# Patient Record
Sex: Female | Born: 1980 | Race: Black or African American | Hispanic: No | Marital: Single | Smoking: Former smoker
Health system: Southern US, Community
[De-identification: ages and names within clinical notes are randomized; demographics above are authoritative.]

## PROBLEM LIST (undated history)

## (undated) DIAGNOSIS — Z5189 Encounter for other specified aftercare: Secondary | ICD-10-CM

## (undated) DIAGNOSIS — E119 Type 2 diabetes mellitus without complications: Secondary | ICD-10-CM

## (undated) DIAGNOSIS — F419 Anxiety disorder, unspecified: Secondary | ICD-10-CM

## (undated) DIAGNOSIS — K219 Gastro-esophageal reflux disease without esophagitis: Secondary | ICD-10-CM

## (undated) DIAGNOSIS — I10 Essential (primary) hypertension: Secondary | ICD-10-CM

## (undated) HISTORY — PX: TUBAL LIGATION: SHX77

## (undated) HISTORY — PX: ABDOMINAL HYSTERECTOMY: SHX81

## (undated) HISTORY — PX: ANKLE SURGERY: SHX546

---

## 2009-12-09 ENCOUNTER — Ambulatory Visit: Payer: Self-pay | Admitting: Diagnostic Radiology

## 2009-12-09 ENCOUNTER — Emergency Department (HOSPITAL_BASED_OUTPATIENT_CLINIC_OR_DEPARTMENT_OTHER): Admission: EM | Admit: 2009-12-09 | Discharge: 2009-12-09 | Payer: Self-pay | Admitting: Emergency Medicine

## 2010-02-27 ENCOUNTER — Emergency Department (HOSPITAL_BASED_OUTPATIENT_CLINIC_OR_DEPARTMENT_OTHER): Admission: EM | Admit: 2010-02-27 | Discharge: 2010-02-27 | Payer: Self-pay | Admitting: Emergency Medicine

## 2010-08-11 ENCOUNTER — Emergency Department (HOSPITAL_BASED_OUTPATIENT_CLINIC_OR_DEPARTMENT_OTHER): Admission: EM | Admit: 2010-08-11 | Discharge: 2010-08-11 | Payer: Self-pay | Admitting: Emergency Medicine

## 2010-08-13 ENCOUNTER — Emergency Department (HOSPITAL_BASED_OUTPATIENT_CLINIC_OR_DEPARTMENT_OTHER): Admission: EM | Admit: 2010-08-13 | Discharge: 2010-08-13 | Payer: Self-pay | Admitting: Emergency Medicine

## 2011-01-08 LAB — RAPID STREP SCREEN (MED CTR MEBANE ONLY): Streptococcus, Group A Screen (Direct): NEGATIVE

## 2011-05-18 ENCOUNTER — Emergency Department (INDEPENDENT_AMBULATORY_CARE_PROVIDER_SITE_OTHER): Payer: Medicaid Other

## 2011-05-18 ENCOUNTER — Emergency Department (HOSPITAL_BASED_OUTPATIENT_CLINIC_OR_DEPARTMENT_OTHER)
Admission: EM | Admit: 2011-05-18 | Discharge: 2011-05-18 | Disposition: A | Discharge status: Home / Self Care | Payer: Medicaid Other | Attending: Emergency Medicine | Admitting: Emergency Medicine

## 2011-05-18 ENCOUNTER — Encounter: Payer: Self-pay | Admitting: *Deleted

## 2011-05-18 DIAGNOSIS — F17200 Nicotine dependence, unspecified, uncomplicated: Secondary | ICD-10-CM | POA: Insufficient documentation

## 2011-05-18 DIAGNOSIS — W19XXXA Unspecified fall, initial encounter: Secondary | ICD-10-CM | POA: Insufficient documentation

## 2011-05-18 DIAGNOSIS — M25579 Pain in unspecified ankle and joints of unspecified foot: Secondary | ICD-10-CM

## 2011-05-18 DIAGNOSIS — Y92009 Unspecified place in unspecified non-institutional (private) residence as the place of occurrence of the external cause: Secondary | ICD-10-CM | POA: Insufficient documentation

## 2011-05-18 DIAGNOSIS — S93409A Sprain of unspecified ligament of unspecified ankle, initial encounter: Secondary | ICD-10-CM

## 2011-05-18 DIAGNOSIS — R609 Edema, unspecified: Secondary | ICD-10-CM

## 2011-05-18 DIAGNOSIS — W010XXA Fall on same level from slipping, tripping and stumbling without subsequent striking against object, initial encounter: Secondary | ICD-10-CM

## 2011-05-18 DIAGNOSIS — S82899A Other fracture of unspecified lower leg, initial encounter for closed fracture: Secondary | ICD-10-CM

## 2011-05-18 MED ORDER — IBUPROFEN 600 MG PO TABS: 600.0000 mg | ORAL_TABLET | Freq: Four times a day (QID) | ORAL | Status: AC | PRN

## 2011-05-18 MED ORDER — ACETAMINOPHEN-CODEINE #3 300-30 MG PO TABS: 1.0000 | ORAL_TABLET | Freq: Four times a day (QID) | ORAL | Status: DC | PRN

## 2011-05-18 MED ORDER — IBUPROFEN 800 MG PO TABS
800.0000 mg | ORAL_TABLET | Freq: Once | ORAL | Status: AC
  Administered 2011-05-18: 800 mg via ORAL
  Filled 2011-05-18: qty 1

## 2011-05-18 MED ORDER — ACETAMINOPHEN-CODEINE #3 300-30 MG PO TABS
2.0000 | ORAL_TABLET | Freq: Once | ORAL | Status: AC
  Administered 2011-05-18: 2 via ORAL
  Filled 2011-05-18: qty 2

## 2011-05-18 NOTE — ED Notes (Signed)
Tripped over cords and twisted right ankle around 1:30am. Woke up at 4:30 with increased pain and swelling.

## 2011-05-18 NOTE — Discharge Instructions (Signed)
 Ankle Sprain An ankle sprain is an injury to the ligaments that hold the ankle joint together.   CAUSE The injury is usually caused by a fall or by twisting the ankle. It is important to tell your caregiver how the injury occurred and whether or not you were able to walk immediately after the injury.  SYMPTOMS Pain is the primary symptom. It may be present at rest or only when you are trying to stand or walk. The ankle will likely be swollen. Bruising may develop immediately or after 1 or 2 days. It may be difficult or impossible to stand or walk. This depends on the severity of the sprain. DIAGNOSIS Your caregiver can determine if a sprain has occurred based on the accident details and on examination of your ankle. Examination will include pressing and squeezing areas of the foot and ankle. Your caregiver will try to move the ankle in certain ways. X-rays may be used to be sure a bone was not broken, or that the ligament did not pull off of a bone (avulsion). There are standard guidelines that can reliably determine if an x-ray is needed. RISKS AND COMPLICATIONS A person who has sprained their ankle will be more prone to a repeat sprain. Long term pain with standing or walking or difficulty walking (chronic instability of the ankle) may result from an ankle sprain. TREATMENT Rest, ice, elevation, and compression are the basic modes of treatment (see home care instructions, below). Certain types of braces can help stabilize the ankle and allow early return to walking. Your caregiver can make a recommendation for this. Medication may be recommended for pain. You may be referred to an orthopedist or a physical therapist for certain types of severe sprains. HOME CARE INSTRUCTIONS  Apply ice to the sore area for 10 minutes 3 times per day. Do this while you are awake for the first 2 days, or as directed. This can be stopped when the swelling goes away. Put the ice in a plastic bag and place a towel  between the bag of ice and your skin.   Keep your leg elevated when possible to lessen swelling.   If your caregiver recommends crutches, use them as instructed with a non-weight bearing cast for 1 week. Then, you may walk on your ankle as the pain allows, or as instructed. Gradually, put weight on the affected ankle. Continue to use crutches or cane until you can walk without causing pain.   If a plaster splint was applied, wear the splint until you are seen for a follow-up examination. Rest it on nothing harder than a pillow the first 24 hours. DO NOT put weight on it. DO NOT get it wet. You may take it off to take a shower or bath.   You may have been given an elastic bandage to use with the plaster splint, or you may have been given a elastic bandage to use alone. The elastic bandage is too tight if you have numbness, tingling, or if your foot becomes cold and blue. Adjust the bandage to make it comfortable.   If an air splint was applied, you may blow more air into it or take some out to make it more comfortable. You may take it off at night and to take a shower or bath. Wiggle your toes in the splint several times per day if you are able.   Only take over-the-counter or prescription medicines for pain, discomfort, or fever as directed by your caregiver.  Do not drive a vehicle until your caregiver specifically tells you it is safe to do so.  SEEK MEDICAL CARE IF:  You have an increase in bruising, swelling, or pain.   You notice coldness of your toes.   Pain relief is not achieved with medications.  SEEK IMMEDIATE MEDICAL CARE IF: your toes are numb or blue or you have severe pain. MAKE SURE YOU:   Understand these instructions.   Will watch your condition.   Will get help right away if you are not doing well or get worse.  Document Released: 10/13/2005 Document Re-Released: 01/09/2009 [facility] Patient Information 2011 [place], [place].     Narcotic and benzodiazepine use  may cause drowsiness, slowed breathing or dependence.  Please use with caution and do not drive, operate machinery or watch young children alone while taking them.  Taking combinations of these medications or drinking alcohol will potentiate these effects.

## 2011-05-18 NOTE — ED Provider Notes (Addendum)
History     Chief Complaint  Patient presents with  . Ankle Pain   Patient is a 30 y.o. female presenting with ankle pain. The history is provided by the patient.  Ankle Pain  The incident occurred 3 to 5 hours ago. The incident occurred at home. The injury mechanism was a fall and torsion. The pain is present in the right ankle. The pain is severe. The pain has been constant since onset. Associated symptoms include inability to bear weight and loss of motion. Pertinent negatives include no numbness, no muscle weakness, no loss of sensation and no tingling. The symptoms are aggravated by bearing weight, palpation and activity. She has tried nothing for the symptoms.    History reviewed. No pertinent past medical history.  History reviewed. No pertinent past surgical history.  History reviewed. No pertinent family history.  History  Substance Use Topics  . Smoking status: Current Everyday Smoker  . Smokeless tobacco: Not on file  . Alcohol Use: No    OB History    Grav Para Term Preterm Abortions TAB SAB Ect Mult Living                  Review of Systems  Respiratory: Negative for chest tightness and shortness of breath.   Musculoskeletal: Positive for joint swelling. Negative for back pain.  Skin: Negative for wound.  Neurological: Negative for tingling, weakness and numbness.  All other systems reviewed and are negative.    Physical Exam  BP 135/79  Pulse 83  Temp(Src) 99.2 F (37.3 C) (Oral)  Resp 18  SpO2 100%  LMP 04/28/2011  Physical Exam  Constitutional: She appears well-developed and well-nourished.  Musculoskeletal: She exhibits edema.       Right knee: She exhibits normal range of motion. no tenderness found.       Right ankle: She exhibits decreased range of motion, swelling and ecchymosis. She exhibits no deformity, no laceration and normal pulse. tenderness. Lateral malleolus and medial malleolus tenderness found. No head of 5th metatarsal and no  proximal fibula tenderness found. Achilles tendon exhibits no pain, no defect and normal Thompson's test results.    ED Course  Procedures  MDM [place] rules apply, will get plain film, no foot tenderness.      6:43 AM I reviewed plain films of ankle, no obv fracture seen.  Splint applied (ASO) and crutches provided for comfort.  RICE therapy discussed  Gavin Pound. Oletta Lamas, MD 05/18/11 [phone]   Discussed with radiology.  Some widening of ankle mortise seen on one view.  Also possible lucency through distal fibula seen on one view not the other 2 views.  Pt did not have tenderness of fibula proximally or middle of lower leg so clinically I doubt fracture there.  Pt did not have any ankle anterior or posterior drawer instability on exam.  Pt placed in bulky ASO splint, crutches provided and referred to Dr. Pearletha Forge for follow up appointment which pt could use in follow up.  Pt told about overread results.  Gavin Pound. Oletta Lamas, MD 05/18/11 [phone]

## 2011-05-18 NOTE — ED Notes (Signed)
Dr Oletta Lamas called flow manager with instructions to call patient regarding abnormal ankle x-ray. Dr Oletta Lamas gave telephone order to call patient and have them return for repeat ankle x-ray and tib/fib x-ray or if patient refused to return stress no weight bearing on ankle and instruction prompt follow-up with orthopedic tomorrow as instructed in d/c instructions. Contacted patient by phone with information concerning x-ray and MD instructions to return. Patient stated she would be returning to ED.

## 2011-05-19 ENCOUNTER — Emergency Department (INDEPENDENT_AMBULATORY_CARE_PROVIDER_SITE_OTHER): Payer: Medicaid Other

## 2011-05-19 ENCOUNTER — Encounter (HOSPITAL_BASED_OUTPATIENT_CLINIC_OR_DEPARTMENT_OTHER): Payer: Self-pay | Admitting: *Deleted

## 2011-05-19 ENCOUNTER — Emergency Department (HOSPITAL_BASED_OUTPATIENT_CLINIC_OR_DEPARTMENT_OTHER)
Admission: EM | Admit: 2011-05-19 | Discharge: 2011-05-20 | Disposition: A | Discharge status: Home / Self Care | Payer: Medicaid Other | Attending: Emergency Medicine | Admitting: Emergency Medicine

## 2011-05-19 DIAGNOSIS — M79609 Pain in unspecified limb: Secondary | ICD-10-CM

## 2011-05-19 DIAGNOSIS — M25579 Pain in unspecified ankle and joints of unspecified foot: Secondary | ICD-10-CM

## 2011-05-19 DIAGNOSIS — W19XXXA Unspecified fall, initial encounter: Secondary | ICD-10-CM | POA: Insufficient documentation

## 2011-05-19 DIAGNOSIS — S82409A Unspecified fracture of shaft of unspecified fibula, initial encounter for closed fracture: Secondary | ICD-10-CM

## 2011-05-19 DIAGNOSIS — S82899A Other fracture of unspecified lower leg, initial encounter for closed fracture: Secondary | ICD-10-CM

## 2011-05-19 DIAGNOSIS — Y92009 Unspecified place in unspecified non-institutional (private) residence as the place of occurrence of the external cause: Secondary | ICD-10-CM | POA: Insufficient documentation

## 2011-05-19 MED ORDER — OXYCODONE-ACETAMINOPHEN 5-325 MG PO TABS: 2.0000 | ORAL_TABLET | ORAL | Status: AC | PRN

## 2011-05-19 NOTE — ED Provider Notes (Signed)
History     Chief Complaint  Patient presents with  . Extremity Pain  . Foot Injury   Patient is a 30 y.o. female presenting with extremity pain and foot injury. The history is provided by the patient. No language interpreter was used.  Extremity Pain This is a new problem. The current episode started yesterday. The problem occurs constantly. The problem has been unchanged. Pertinent negatives include no numbness. The symptoms are aggravated by walking and standing. She has tried oral narcotics for the symptoms. The treatment provided mild relief.  Foot Injury  The incident occurred yesterday. The incident occurred at home. The injury mechanism was a fall. The pain is present in the right ankle and right leg. The quality of the pain is described as aching and throbbing. The pain is at a severity of 7/10. The pain is moderate. The pain has been constant since onset. Associated symptoms include tingling. Pertinent negatives include no numbness. She reports no foreign bodies present. The symptoms are aggravated by bearing weight, activity and palpation. Treatments tried: narcotics and aso splint. The treatment provided mild relief.    History reviewed. No pertinent past medical history.  History reviewed. No pertinent past surgical history.  History reviewed. No pertinent family history.  History  Substance Use Topics  . Smoking status: Current Everyday Smoker  . Smokeless tobacco: Not on file  . Alcohol Use: No    OB History    Grav Para Term Preterm Abortions TAB SAB Ect Mult Living                  Review of Systems  Neurological: Positive for tingling. Negative for numbness.  All other systems reviewed and are negative.    Physical Exam  BP 146/73  Pulse 96  Temp(Src) 98.4 F (36.9 C) (Oral)  Resp 18  SpO2 100%  LMP 04/28/2011  Physical Exam  Nursing note and vitals reviewed. Constitutional: She is oriented to person, place, and time. She appears well-developed and  well-nourished.  HENT:  Head: Normocephalic and atraumatic.  Eyes: Pupils are equal, round, and reactive to light.  Neck: Normal range of motion.  Cardiovascular: Normal rate and regular rhythm.   Pulmonary/Chest: Effort normal and breath sounds normal.  Musculoskeletal:       Right lower leg: She exhibits tenderness and swelling.       Right foot: She exhibits tenderness and swelling.  Neurological: She is alert and oriented to person, place, and time.  Skin: Skin is warm and dry.    ED Course  Procedures   Dg Tibia/fibula Right  05/19/2011  *RADIOLOGY REPORT*  Clinical Data: Pain post fall  RIGHT TIBIA AND FIBULA - 2 VIEW  Comparison:   the previous day's study  Findings: There is a minimally comminuted fracture of the distal fibular diaphysis, with posterior comminuted fragment displaced posteriorly approximately 4 mm.  IMPRESSION:  Minimally comminuted distal fibular shaft fracture.  Original Report Authenticated By: Thora Lance III, M.D.   Dg Ankle Complete Right  05/19/2011  *RADIOLOGY REPORT*  Clinical Data: Pain post fall  RIGHT ANKLE - COMPLETE 3+ VIEW  Comparison:   the previous day's study  Findings: Fibular diaphyseal comminuted fractures again noted. There is widening of the ankle mortise laterally.  The posterior malleolar avulsion fracture seen previously is less conspicuous. No other fractures evident.  IMPRESSION:  1.  Fibular fracture with widening of the ankle mortise laterally suggesting disruption of the tibiofibular syndesmosis, potentially unstable injury.  Original  Report Authenticated By: Osa Craver, M.D.    MDM Discussed finding with Dr. Victorino Dike with ortho and we discussed with the pt that appropriate splint and non wt bearing and pt to have surgery and be seen on Wednesday:pt splint placed by nursing staff      Teressa Lower, NP 05/19/11 [phone]  Teressa Lower, NP 05/19/11 2345

## 2011-05-19 NOTE — ED Notes (Signed)
Patient splinted short leg posterior/stirrup with fiber glass splinting materials and ace wraps. Patient denies crutches today stating she was fitted appropriately for them yesterday.

## 2011-05-19 NOTE — ED Notes (Signed)
Pt assisted to restroom, pt tolerated well. Pt assisted back to bed, ice pack re-applied to right ankle.

## 2011-05-19 NOTE — ED Notes (Signed)
Pt states that ankle has increased swelling from yesterday, denies using any ice therapy at home and states that she hasn't been wearing ankle brace d/t pain.

## 2011-05-19 NOTE — ED Notes (Signed)
Pt here for repeat ankle and tib/fib xray per Dr Oletta Lamas and previous ER notes

## 2011-05-20 NOTE — ED Provider Notes (Signed)
Medical screening examination/treatment/procedure(s) were performed by non-physician practitioner and as supervising physician I was immediately available for consultation/collaboration.  Sherry Blanchard 05/20/11 [phone]

## 2011-05-23 ENCOUNTER — Ambulatory Visit (HOSPITAL_COMMUNITY): Payer: Medicaid Other | Attending: Orthopedic Surgery

## 2011-05-23 ENCOUNTER — Ambulatory Visit (HOSPITAL_BASED_OUTPATIENT_CLINIC_OR_DEPARTMENT_OTHER)
Admission: RE | Admit: 2011-05-23 | Discharge: 2011-05-23 | Disposition: A | Discharge status: Home / Self Care | Payer: Medicaid Other | Source: Ambulatory Visit | Attending: Orthopedic Surgery | Admitting: Orthopedic Surgery

## 2011-05-23 DIAGNOSIS — F17200 Nicotine dependence, unspecified, uncomplicated: Secondary | ICD-10-CM | POA: Insufficient documentation

## 2011-05-23 DIAGNOSIS — Z01812 Encounter for preprocedural laboratory examination: Secondary | ICD-10-CM | POA: Insufficient documentation

## 2011-05-23 DIAGNOSIS — Y92009 Unspecified place in unspecified non-institutional (private) residence as the place of occurrence of the external cause: Secondary | ICD-10-CM | POA: Insufficient documentation

## 2011-05-23 DIAGNOSIS — S8263XA Displaced fracture of lateral malleolus of unspecified fibula, initial encounter for closed fracture: Secondary | ICD-10-CM | POA: Insufficient documentation

## 2011-05-23 DIAGNOSIS — W010XXA Fall on same level from slipping, tripping and stumbling without subsequent striking against object, initial encounter: Secondary | ICD-10-CM | POA: Insufficient documentation

## 2011-05-23 DIAGNOSIS — K219 Gastro-esophageal reflux disease without esophagitis: Secondary | ICD-10-CM | POA: Insufficient documentation

## 2011-05-23 DIAGNOSIS — S93439A Sprain of tibiofibular ligament of unspecified ankle, initial encounter: Secondary | ICD-10-CM | POA: Insufficient documentation

## 2011-05-25 NOTE — Op Note (Signed)
Sherry Blanchard, Sherry Blanchard             ACCOUNT NO.:  [id number]  MEDICAL RECORD NO.:  [id number]  LOCATION:                                 FACILITY:  PHYSICIAN:  Toni Arthurs, MD        DATE OF BIRTH:  [birth year]  DATE OF PROCEDURE:  05/23/2011 DATE OF DISCHARGE:                              OPERATIVE REPORT   PREOPERATIVE DIAGNOSES: 1. Right ankle Weber C fracture. 2. Disruption of right ankle syndesmosis.  POSTOPERATIVE DIAGNOSES: 1. Right ankle Weber C fracture. 2. Disruption of right ankle syndesmosis.  PROCEDURE: 1. Open reduction and internal fixation of right ankle syndesmosis     disruption. 2. Closed treatment of right ankle lateral malleolus fracture. 3. Intraoperative stress examination of the right ankle. 4. Intraoperative interpretation of fluoroscopic imaging.  SURGEON:  Toni Arthurs, MD  ANESTHESIA:  General, regional.  IV FLUIDS:  See anesthesia record.  ESTIMATED BLOOD LOSS:  Minimal.  TOURNIQUET TIME:  35 minutes at 250 mmHg.  COMPLICATIONS:  None apparent.  DISPOSITION:  Extubated in awake and stable to recovery.  INDICATIONS FOR PROCEDURE:  The patient is a 30 year old female who twisted her ankle, falling in her hall at home approximately 5 days ago. She had x-rays which showed a Weber C ankle fracture with disruption of the syndesmosis and widening of the ankle mortise.  She presents now for operative treatment of this unstable injury.  She understands the risks and benefits of this procedure as well as the alternative treatment options and would like to proceed.  She specifically understands risks of bleeding, infection, nerve damage, blood clots, need for additional surgery, ankle arthritis, amputation, and death.  PROCEDURE IN DETAIL:  After preoperative consent was obtained and the correct operative site was identified, the patient was brought to the operating room and placed supine on the operating table.  General anesthesia was induced.   Preoperative antibiotics were administered. Surgical time-out was taken.  The right lower extremity was prepped and draped in standard sterile fashion with tourniquet around the thigh.  AP fluoroscopic images were obtained, showing the widening of the ankle mortise.  The incision location was localized with an elevator.  The extremity was exsanguinated and the tourniquet was inflated to 250 mmHg. A lateral incision was made and sharp dissection was carried down through the skin.  Blunt dissection was carried down through the subcutaneous tissue to the level of the lateral fibula.  The syndesmosis disruption was reduced with a Weber clamp.  A mortise view was obtained, showing appropriate reduction of the syndesmosis as well as the ankle mortise.  A four-hole plate was selected and applied to the lateral aspect of the fibula.  A second most distal hole was drilled across the syndesmosis and into the medial tibia.  A fully-threaded 4 mm x 50 mm screw was inserted and noted to have appropriate purchase.  The next most proximal hole was selected and drilled.  An Arthrex TightRope was passed through the hole and toggled on the far cortex of the tibia.  It was tightened down.  The clamp was removed.  A mortise view of the ankle showed anatomic reduction of the syndesmosis and the ankle mortise.  A stress view was then obtained in dorsiflexion and external rotation and similarly showed anatomic reduction of the mortise.  All suture ends were trimmed and the wound was irrigated copiously.  The lateral incision was closed with inverted simple sutures of 3-0 Monocryl and a running 3-0 Prolene to close the skin incision.  Sterile dressings were applied followed by a well-padded short-leg splint.  Tourniquet was released at 35 minutes after application of the splint.  The patient was then awakened by anesthesia and transported to the recovery room in stable condition.  FOLLOWUP PLAN:  The patient  will be nonweightbearing on the right lower extremity.  She will follow up with me in 2 weeks for suture removal and conversion to a cast.     Toni Arthurs, MD     JH/MEDQ  D:  05/23/2011  T:  05/23/2011  Job:  [id number]  Electronically Signed by Jonny Ruiz Jonnatan Hanners  on 05/25/2011 05:59:59 PM

## 2011-07-16 ENCOUNTER — Ambulatory Visit: Payer: Medicaid Other | Admitting: Physical Therapy

## 2011-07-23 ENCOUNTER — Ambulatory Visit: Payer: Medicaid Other | Attending: Orthopedic Surgery | Admitting: Physical Therapy

## 2011-07-23 DIAGNOSIS — M25673 Stiffness of unspecified ankle, not elsewhere classified: Secondary | ICD-10-CM | POA: Insufficient documentation

## 2011-07-23 DIAGNOSIS — R262 Difficulty in walking, not elsewhere classified: Secondary | ICD-10-CM | POA: Insufficient documentation

## 2011-07-23 DIAGNOSIS — IMO0001 Reserved for inherently not codable concepts without codable children: Secondary | ICD-10-CM | POA: Insufficient documentation

## 2011-07-23 DIAGNOSIS — R5381 Other malaise: Secondary | ICD-10-CM | POA: Insufficient documentation

## 2011-07-23 DIAGNOSIS — M25579 Pain in unspecified ankle and joints of unspecified foot: Secondary | ICD-10-CM | POA: Insufficient documentation

## 2011-07-23 DIAGNOSIS — M25676 Stiffness of unspecified foot, not elsewhere classified: Secondary | ICD-10-CM | POA: Insufficient documentation

## 2011-08-13 ENCOUNTER — Ambulatory Visit: Payer: Medicaid Other | Attending: Orthopedic Surgery | Admitting: Physical Therapy

## 2011-08-13 DIAGNOSIS — IMO0001 Reserved for inherently not codable concepts without codable children: Secondary | ICD-10-CM | POA: Insufficient documentation

## 2011-08-13 DIAGNOSIS — R262 Difficulty in walking, not elsewhere classified: Secondary | ICD-10-CM | POA: Insufficient documentation

## 2011-08-13 DIAGNOSIS — R5381 Other malaise: Secondary | ICD-10-CM | POA: Insufficient documentation

## 2011-08-13 DIAGNOSIS — M25676 Stiffness of unspecified foot, not elsewhere classified: Secondary | ICD-10-CM | POA: Insufficient documentation

## 2011-08-13 DIAGNOSIS — M25579 Pain in unspecified ankle and joints of unspecified foot: Secondary | ICD-10-CM | POA: Insufficient documentation

## 2011-08-13 DIAGNOSIS — M25673 Stiffness of unspecified ankle, not elsewhere classified: Secondary | ICD-10-CM | POA: Insufficient documentation

## 2011-08-17 ENCOUNTER — Emergency Department (INDEPENDENT_AMBULATORY_CARE_PROVIDER_SITE_OTHER): Payer: Medicaid Other

## 2011-08-17 ENCOUNTER — Emergency Department (HOSPITAL_BASED_OUTPATIENT_CLINIC_OR_DEPARTMENT_OTHER)
Admission: EM | Admit: 2011-08-17 | Discharge: 2011-08-17 | Disposition: A | Discharge status: Home / Self Care | Payer: Medicaid Other | Attending: Emergency Medicine | Admitting: Emergency Medicine

## 2011-08-17 ENCOUNTER — Encounter (HOSPITAL_BASED_OUTPATIENT_CLINIC_OR_DEPARTMENT_OTHER): Payer: Self-pay | Admitting: *Deleted

## 2011-08-17 DIAGNOSIS — R109 Unspecified abdominal pain: Secondary | ICD-10-CM

## 2011-08-17 DIAGNOSIS — R110 Nausea: Secondary | ICD-10-CM

## 2011-08-17 HISTORY — DX: Gastro-esophageal reflux disease without esophagitis: K21.9

## 2011-08-17 HISTORY — DX: Anxiety disorder, unspecified: F41.9

## 2011-08-17 LAB — URINALYSIS, ROUTINE W REFLEX MICROSCOPIC
Bilirubin Urine: NEGATIVE
Glucose, UA: NEGATIVE mg/dL
Ketones, ur: 15 mg/dL — AB
Protein, ur: NEGATIVE mg/dL

## 2011-08-17 LAB — URINE MICROSCOPIC-ADD ON

## 2011-08-17 NOTE — ED Notes (Signed)
D/c home- no rx given- ambulatory without difficulty at d/c

## 2011-08-17 NOTE — ED Notes (Signed)
C/o upper abdominal pain after eating x 3 days- nausea without vomiting

## 2011-08-17 NOTE — ED Provider Notes (Signed)
History     CSN: [id number] Arrival date & time: 08/17/2011  7:44 PM   First MD Initiated Contact with Patient 08/17/11 1946      Chief Complaint  Patient presents with  . Abdominal Pain    (Consider location/radiation/quality/duration/timing/severity/associated sxs/prior treatment) HPI Comments: Pt states that she just finished here period:pt states that for the last 3 nights she has woken up with some generalized lower abdominal pain that goes away on it own:pt denies dysuria or vag discharge or fever:pt states that she feels some pressure in her rectum and she has had some constipation in the last couple of days  Patient is a 30 y.o. female presenting with abdominal pain. The history is provided by the patient. No language interpreter was used.  Abdominal Pain The primary symptoms of the illness include abdominal pain. The current episode started more than 2 days ago. The onset of the illness was sudden. The problem has been resolved.  The patient states that she believes she is currently not pregnant. The patient has not had a change in bowel habit. Additional symptoms associated with the illness include constipation. Symptoms associated with the illness do not include hematuria or back pain.    Past Medical History  Diagnosis Date  . Blood transfusion   . Acid reflux   . Anxiety     Past Surgical History  Procedure Date  . Ankle surgery   . Tubal ligation     No family history on file.  History  Substance Use Topics  . Smoking status: Current Everyday Smoker    Types: Cigars  . Smokeless tobacco: Not on file  . Alcohol Use: Yes     occasional    OB History    Grav Para Term Preterm Abortions TAB SAB Ect Mult Living                  Review of Systems  Gastrointestinal: Positive for abdominal pain and constipation.  Genitourinary: Negative for hematuria.  Musculoskeletal: Negative for back pain.  All other systems reviewed and are negative.    Allergies   Review of patient's allergies indicates no known allergies.  Home Medications   Current Outpatient Rx  Name Route Sig Dispense Refill  . ACETAMINOPHEN-CODEINE #3 300-30 MG PO TABS Oral Take 1 tablet by mouth 2 (two) times daily.      Marland Kitchen DIPHENHYDRAMINE HCL 25 MG PO CAPS Oral Take 25 mg by mouth as needed. Sleep       BP 143/70  Pulse 67  Temp 98.4 F (36.9 C)  Resp 20  Ht 5\' 7"  (1.702 m)  Wt 184 lb (83.462 kg)  BMI 28.82 kg/m2  SpO2 100%  LMP 08/12/2011  Physical Exam  Vitals reviewed. Constitutional: She is oriented to person, place, and time. She appears well-developed and well-nourished.  HENT:  Head: Normocephalic and atraumatic.  Eyes: Pupils are equal, round, and reactive to light.  Neck: Normal range of motion. Neck supple.  Cardiovascular: Normal rate and regular rhythm.   Pulmonary/Chest: Effort normal and breath sounds normal.  Abdominal: Soft. Bowel sounds are normal.  Musculoskeletal: Normal range of motion.  Neurological: She is alert and oriented to person, place, and time.  Skin: Skin is warm and dry.  Psychiatric: She has a normal mood and affect.    ED Course  Procedures (including critical care time)  Labs Reviewed  URINALYSIS, ROUTINE W REFLEX MICROSCOPIC - Abnormal; Notable for the following:    Appearance CLOUDY (*)  Specific Gravity, Urine 1.036 (*)    Hgb urine dipstick SMALL (*)    Ketones, ur 15 (*)    All other components within normal limits  URINE MICROSCOPIC-ADD ON - Abnormal; Notable for the following:    Squamous Epithelial / LPF MANY (*)    Bacteria, UA MANY (*)    All other components within normal limits  PREGNANCY, URINE   Dg Abd Acute W/chest  08/17/2011  *RADIOLOGY REPORT*  Clinical Data: Upper abdominal pain after eating, for 3 days; nausea.  ACUTE ABDOMEN SERIES (ABDOMEN 2 VIEW & CHEST 1 VIEW)  Comparison: None.  Findings: The lungs are well-aerated and clear.  There is no evidence of focal opacification, pleural  effusion or pneumothorax. The cardiomediastinal silhouette is within normal limits.  The visualized bowel gas pattern is unremarkable. Stool and air are noted throughout the colon; there is no evidence of small bowel dilatation to suggest obstruction.  No free intra-abdominal air is identified on the provided upright view.  No acute osseous abnormalities are seen; the sacroiliac joints are unremarkable in appearance.  IMPRESSION:  1.  Unremarkable bowel gas pattern; no free intra-abdominal air seen. 2.  No acute cardiopulmonary process identified.  Original Report Authenticated By: Tonia Ghent, M.D.     1. Abdominal pain       MDM  Pt in no acute distress:pt has a benign abdomen on exam:urine sent for culture as no clearly a uti:don't feel any imaging is needed at this time    Medical screening examination/treatment/procedure(s) were performed by non-physician practitioner and as supervising physician I was immediately available for consultation/collaboration. Osvaldo Human, M.D.    Teressa Lower, NP 08/17/11 2308  Carleene Cooper III, MD 08/19/11 [phone]

## 2011-08-21 ENCOUNTER — Ambulatory Visit: Payer: Medicaid Other | Admitting: Physical Therapy

## 2011-08-29 ENCOUNTER — Ambulatory Visit: Payer: Medicaid Other | Attending: Orthopedic Surgery | Admitting: Physical Therapy

## 2011-08-29 DIAGNOSIS — IMO0001 Reserved for inherently not codable concepts without codable children: Secondary | ICD-10-CM | POA: Insufficient documentation

## 2011-08-29 DIAGNOSIS — M25676 Stiffness of unspecified foot, not elsewhere classified: Secondary | ICD-10-CM | POA: Insufficient documentation

## 2011-08-29 DIAGNOSIS — R262 Difficulty in walking, not elsewhere classified: Secondary | ICD-10-CM | POA: Insufficient documentation

## 2011-08-29 DIAGNOSIS — R5381 Other malaise: Secondary | ICD-10-CM | POA: Insufficient documentation

## 2011-08-29 DIAGNOSIS — M25579 Pain in unspecified ankle and joints of unspecified foot: Secondary | ICD-10-CM | POA: Insufficient documentation

## 2011-08-29 DIAGNOSIS — M25673 Stiffness of unspecified ankle, not elsewhere classified: Secondary | ICD-10-CM | POA: Insufficient documentation

## 2012-06-29 ENCOUNTER — Emergency Department (HOSPITAL_BASED_OUTPATIENT_CLINIC_OR_DEPARTMENT_OTHER)
Admission: EM | Admit: 2012-06-29 | Discharge: 2012-06-29 | Disposition: A | Discharge status: Home / Self Care | Payer: Self-pay | Attending: Emergency Medicine | Admitting: Emergency Medicine

## 2012-06-29 ENCOUNTER — Encounter (HOSPITAL_BASED_OUTPATIENT_CLINIC_OR_DEPARTMENT_OTHER): Payer: Self-pay | Admitting: *Deleted

## 2012-06-29 DIAGNOSIS — K219 Gastro-esophageal reflux disease without esophagitis: Secondary | ICD-10-CM | POA: Insufficient documentation

## 2012-06-29 DIAGNOSIS — F411 Generalized anxiety disorder: Secondary | ICD-10-CM | POA: Insufficient documentation

## 2012-06-29 DIAGNOSIS — N390 Urinary tract infection, site not specified: Secondary | ICD-10-CM | POA: Insufficient documentation

## 2012-06-29 DIAGNOSIS — F17200 Nicotine dependence, unspecified, uncomplicated: Secondary | ICD-10-CM | POA: Insufficient documentation

## 2012-06-29 LAB — URINALYSIS, ROUTINE W REFLEX MICROSCOPIC
Glucose, UA: NEGATIVE mg/dL
Ketones, ur: 15 mg/dL — AB
Protein, ur: 30 mg/dL — AB
Urobilinogen, UA: 1 mg/dL (ref 0.0–1.0)
pH: 6 (ref 5.0–8.0)

## 2012-06-29 LAB — URINE MICROSCOPIC-ADD ON

## 2012-06-29 LAB — PREGNANCY, URINE: Preg Test, Ur: NEGATIVE

## 2012-06-29 MED ORDER — CEPHALEXIN 250 MG PO CAPS
500.0000 mg | ORAL_CAPSULE | Freq: Once | ORAL | Status: AC
  Administered 2012-06-29: 500 mg via ORAL
  Filled 2012-06-29: qty 2

## 2012-06-29 MED ORDER — HYDROCODONE-ACETAMINOPHEN 5-325 MG PO TABS: ORAL_TABLET | ORAL | Status: DC

## 2012-06-29 MED ORDER — HYDROCODONE-ACETAMINOPHEN 5-325 MG PO TABS
1.0000 | ORAL_TABLET | Freq: Once | ORAL | Status: AC
  Administered 2012-06-29: 1 via ORAL
  Filled 2012-06-29: qty 1

## 2012-06-29 MED ORDER — CEPHALEXIN 500 MG PO CAPS: 500.0000 mg | ORAL_CAPSULE | Freq: Four times a day (QID) | ORAL | Status: AC

## 2012-06-29 MED ORDER — PROMETHAZINE HCL 25 MG PO TABS: 25.0000 mg | ORAL_TABLET | Freq: Four times a day (QID) | ORAL | Status: DC | PRN

## 2012-06-29 MED ORDER — ONDANSETRON 8 MG PO TBDP
8.0000 mg | ORAL_TABLET | Freq: Once | ORAL | Status: AC
  Administered 2012-06-29: 8 mg via ORAL
  Filled 2012-06-29: qty 1

## 2012-06-29 NOTE — ED Notes (Signed)
Pt is having urinary frequency and when she goes to restroom "only a couple drops come out." Pt states that her bladder is throbbing and her pain is "worse than a 10." States she is nauseous but denies vomiting and diarrhea. Pt states her heart is "fluttering."

## 2012-06-29 NOTE — ED Notes (Signed)
Dysuria since this am. No relief with Tylenol and cranberry pills.

## 2012-06-29 NOTE — ED Provider Notes (Signed)
History     CSN: [id number]  Arrival date & time 06/29/12  1407   First MD Initiated Contact with Patient 06/29/12 1442      Chief Complaint  Patient presents with  . Dysuria    (Consider location/radiation/quality/duration/timing/severity/associated sxs/prior treatment) HPI Patient is a 31 year old female who presents today complaining of dysuria that she noted upon waking this morning. She complains of lower, pain with radiation to her lower back. She tried Tylenol as well as "cranberry pills" at home without resolution of her symptoms. She reports her pain as a 10 out of 10. Patient is afebrile and hemodynamically stable on presentation. She describes the pain as a cramping sensation. She did take Midol at around 11 AM without any relief. Patient denies any vaginal discharge and is not concerned that she might have had exposure to any section transmitted diseases. She has history of UTI and reports that this feels similarly. Patient has had nausea but no vomiting. She denies any constipation or diarrhea. There are no other associated or modifying factors. Past Medical History  Diagnosis Date  . Blood transfusion   . Acid reflux   . Anxiety     Past Surgical History  Procedure Date  . Ankle surgery   . Tubal ligation     No family history on file.  History  Substance Use Topics  . Smoking status: Current Everyday Smoker    Types: Cigars  . Smokeless tobacco: Not on file  . Alcohol Use: Yes     occasional    OB History    Grav Para Term Preterm Abortions TAB SAB Ect Mult Living                  Review of Systems  Constitutional: Positive for chills.  HENT: Negative.   Eyes: Negative.   Respiratory: Negative.   Cardiovascular: Negative.   Gastrointestinal: Positive for nausea and abdominal pain.  Genitourinary: Positive for dysuria. Negative for vaginal discharge.  Musculoskeletal: Positive for back pain.  Skin: Negative.   Neurological: Negative.     Hematological: Negative.   Psychiatric/Behavioral: Negative.   All other systems reviewed and are negative.    Allergies  Review of patient's allergies indicates no known allergies.  Home Medications   Current Outpatient Rx  Name Route Sig Dispense Refill  . ACETAMINOPHEN-CODEINE #3 300-30 MG PO TABS Oral Take 1 tablet by mouth 2 (two) times daily.      . CEPHALEXIN 500 MG PO CAPS Oral Take 1 capsule (500 mg total) by mouth 4 (four) times daily. 40 capsule 0  . DIPHENHYDRAMINE HCL 25 MG PO CAPS Oral Take 25 mg by mouth as needed. Sleep     . HYDROCODONE-ACETAMINOPHEN 5-325 MG PO TABS  Take 1-2 tabs po q 6 hours PRN pain 15 tablet 0  . PROMETHAZINE HCL 25 MG PO TABS Oral Take 1 tablet (25 mg total) by mouth every 6 (six) hours as needed for nausea. 30 tablet 0    BP 127/67  Pulse 84  Temp 98.4 F (36.9 C) (Oral)  Resp 20  SpO2 97%  LMP 05/29/2012  Physical Exam  Nursing note and vitals reviewed. GEN: Well-developed, well-nourished female in no distress, uncomfortable appearing HEENT: Atraumatic, normocephalic. Oropharynx clear without erythema EYES: PERRLA BL, no scleral icterus. NECK: Trachea midline, no meningismus CV: regular rate and rhythm. No murmurs, rubs, or gallops PULM: No respiratory distress.  No crackles, wheezes, or rales. GI: soft, mild suprapubic tenderness to palpation. No guarding, rebound. +  bowel sounds  GU: deferred Neuro: cranial nerves grossly 2-12 intact, no abnormalities of strength or sensation, A and O x 3 MSK: Patient moves all 4 extremities symmetrically, no deformity, edema, or injury noted Skin: No rashes petechiae, purpura, or jaundice Psych: no abnormality of mood   ED Course  Procedures (including critical care time)  Labs Reviewed  URINALYSIS, ROUTINE W REFLEX MICROSCOPIC - Abnormal; Notable for the following:    APPearance TURBID (*)     Specific Gravity, Urine 1.031 (*)     Hgb urine dipstick LARGE (*)     Ketones, ur 15 (*)      Protein, ur 30 (*)     Nitrite POSITIVE (*)     Leukocytes, UA LARGE (*)     All other components within normal limits  URINE MICROSCOPIC-ADD ON - Abnormal; Notable for the following:    Squamous Epithelial / LPF FEW (*)     Bacteria, UA FEW (*)     All other components within normal limits  PREGNANCY, URINE  URINE CULTURE   No results found.   1. UTI (urinary tract infection)       MDM  Patient was evaluated by myself. Based on evaluation patient had urinalysis which was positive for UTI. Urine pregnancy was negative. Patient denied any concern for sexual transmitted disease and denies any vaginal discharge. Patient was treated with oral Keflex as well as a dose of oral Zofran and Norco. Patient tolerated this well. She was hemodynamically stable here. Given that symptoms were exactly like prior UTIs as well as patient being hemodynamically stable with no vaginal discharge no additional studies are necessary. Urine culture was sent. Patient was discharged in good condition with prescription for Keflex as well as 15 tabs of Norco and prescription for promethazine which patient is found to be more affordable in the past.        Cyndra Numbers, MD 06/29/12 1623

## 2012-10-29 ENCOUNTER — Emergency Department (HOSPITAL_BASED_OUTPATIENT_CLINIC_OR_DEPARTMENT_OTHER)
Admission: EM | Admit: 2012-10-29 | Discharge: 2012-10-29 | Disposition: A | Discharge status: Home / Self Care | Payer: Self-pay | Attending: Emergency Medicine | Admitting: Emergency Medicine

## 2012-10-29 ENCOUNTER — Encounter (HOSPITAL_BASED_OUTPATIENT_CLINIC_OR_DEPARTMENT_OTHER): Payer: Self-pay | Admitting: *Deleted

## 2012-10-29 ENCOUNTER — Emergency Department (HOSPITAL_BASED_OUTPATIENT_CLINIC_OR_DEPARTMENT_OTHER): Payer: Self-pay

## 2012-10-29 DIAGNOSIS — Z8719 Personal history of other diseases of the digestive system: Secondary | ICD-10-CM | POA: Insufficient documentation

## 2012-10-29 DIAGNOSIS — Z79899 Other long term (current) drug therapy: Secondary | ICD-10-CM | POA: Insufficient documentation

## 2012-10-29 DIAGNOSIS — Z9851 Tubal ligation status: Secondary | ICD-10-CM | POA: Insufficient documentation

## 2012-10-29 DIAGNOSIS — R509 Fever, unspecified: Secondary | ICD-10-CM | POA: Insufficient documentation

## 2012-10-29 DIAGNOSIS — R5381 Other malaise: Secondary | ICD-10-CM | POA: Insufficient documentation

## 2012-10-29 DIAGNOSIS — R5383 Other fatigue: Secondary | ICD-10-CM | POA: Insufficient documentation

## 2012-10-29 DIAGNOSIS — Z8659 Personal history of other mental and behavioral disorders: Secondary | ICD-10-CM | POA: Insufficient documentation

## 2012-10-29 DIAGNOSIS — IMO0001 Reserved for inherently not codable concepts without codable children: Secondary | ICD-10-CM | POA: Insufficient documentation

## 2012-10-29 DIAGNOSIS — F17200 Nicotine dependence, unspecified, uncomplicated: Secondary | ICD-10-CM | POA: Insufficient documentation

## 2012-10-29 MED ORDER — ACETAMINOPHEN 500 MG PO TABS
1000.0000 mg | ORAL_TABLET | Freq: Once | ORAL | Status: AC
  Administered 2012-10-29: 1000 mg via ORAL
  Filled 2012-10-29: qty 2

## 2012-10-29 MED ORDER — HYDROCODONE-HOMATROPINE 5-1.5 MG/5ML PO SYRP: 5.0000 mL | ORAL_SOLUTION | ORAL | Status: DC | PRN

## 2012-10-29 MED ORDER — KETOROLAC TROMETHAMINE 60 MG/2ML IM SOLN
60.0000 mg | Freq: Once | INTRAMUSCULAR | Status: AC
  Administered 2012-10-29: 60 mg via INTRAMUSCULAR
  Filled 2012-10-29: qty 2

## 2012-10-29 MED ORDER — GUAIFENESIN 100 MG/5ML PO LIQD: 100.0000 mg | ORAL | Status: DC | PRN

## 2012-10-29 NOTE — ED Notes (Signed)
Cough with brownish sputum x 2 days. Now her chest hurts when she inhales. Aching all over.

## 2012-10-29 NOTE — ED Provider Notes (Signed)
Medical screening examination/treatment/procedure(s) were performed by non-physician practitioner and as supervising physician I was immediately available for consultation/collaboration.  Doug Sou, MD 10/29/12 [phone]

## 2012-10-29 NOTE — ED Provider Notes (Signed)
History     CSN: [id number]  Arrival date & time 10/29/12  1631   First MD Initiated Contact with Patient 10/29/12 1754      Chief Complaint  Patient presents with  . Cough    (Consider location/radiation/quality/duration/timing/severity/associated sxs/prior treatment) HPI SUBJECTIVE:  Sherry Blanchard is a 32 y.o. female who present complaining of flu-like symptoms: fevers, chills, myalgias, congestion, and  productivecough for 3 days. Denies dyspnea or wheezing. Burning pain in chest with cough. Maliase and fatigue. Denies DOE, SOB, chest tightness or pressure, radiation to left arm, jaw or back, or diaphoresis. Denies dysuria, flank pain, suprapubic pain, frequency, urgency, or hematuria. Denies headaches, light headedness, weakness, visual disturbances. Denies abdominal pain, nausea, vomiting, diarrhea or constipation.      Past Medical History  Diagnosis Date  . Blood transfusion   . Acid reflux   . Anxiety     Past Surgical History  Procedure Date  . Ankle surgery   . Tubal ligation     No family history on file.  History  Substance Use Topics  . Smoking status: Current Every Day Smoker    Types: Cigars  . Smokeless tobacco: Not on file  . Alcohol Use: Yes     Comment: occasional    OB History    Grav Para Term Preterm Abortions TAB SAB Ect Mult Living                  Review of Systems Ten systems reviewed and are negative for acute change, except as noted in the HPI.   Allergies  Review of patient's allergies indicates no known allergies.  Home Medications   Current Outpatient Rx  Name  Route  Sig  Dispense  Refill  . ACETAMINOPHEN-CODEINE #3 300-30 MG PO TABS   Oral   Take 1 tablet by mouth 2 (two) times daily.           Marland Kitchen DIPHENHYDRAMINE HCL 25 MG PO CAPS   Oral   Take 25 mg by mouth as needed. Sleep          . HYDROCODONE-ACETAMINOPHEN 5-325 MG PO TABS      Take 1-2 tabs po q 6 hours PRN pain   15 tablet   0   . PROMETHAZINE  HCL 25 MG PO TABS   Oral   Take 1 tablet (25 mg total) by mouth every 6 (six) hours as needed for nausea.   30 tablet   0     BP 113/57  Pulse 86  Temp 99.7 F (37.6 C) (Oral)  Resp 16  Ht 5' 7.5" (1.715 m)  Wt 185 lb (83.915 kg)  BMI 28.55 kg/m2  SpO2 100%  LMP 10/02/2012  Physical Exam Appears moderately ill but not toxic; temperature as noted in vitals. Ears normal. Eyes:glassy appearance, no discharge  Heart: RRR, NO M/G/R Throat and pharynx normal.   Neck supple. No adenopathyhy in the neck.  Sinuses non tender.  Diffuse wet rhonchi that clear with cough. Abdomen is soft and nontende  ED Course  Procedures (including critical care time)  Labs Reviewed - No data to display Dg Chest 2 View  10/29/2012  *RADIOLOGY REPORT*  Clinical Data: Fever and myalgias.  Smoker.  CHEST - 2 VIEW  Comparison: 12/09/2009.  Findings: Normal sized heart.  Clear lungs.  Normal appearing bones.  IMPRESSION: Normal examination, unchanged.   Original Report Authenticated By: Beckie Salts, M.D.      No diagnosis found.    MDM  8:02 PM Filed Vitals:   10/29/12 1642 10/29/12 1905  BP: 130/64 113/57  Pulse: 116 86  Temp: 100 F (37.8 C) 99.7 F (37.6 C)  TempSrc: Oral Oral  Resp: 20 16  Height: 5' 7.5" (1.715 m)   Weight: 185 lb (83.915 kg)   SpO2: 100% 100%   Patient with symptoms consistent with influenza.  Vitals are stable, low-grade fever.  No signs of dehydration, tolerating PO's.  Lungs with ronchi. Due to patient's presentation and physical exam a chest x-ray was ordered and was negative.  Discussed the cost versus benefit of Tamiflu treatment with the patient.  The patient understands that symptoms are greater than the recommended 24-48 hour window of treatment.  Patient will be discharged with instructions to orally hydrate, rest, and use over-the-counter medications such as anti-inflammatories ibuprofen and Aleve for muscle aches and Tylenol for fever.  Patient will also be  given a cough suppressant.          Arthor Captain, PA-C 10/29/12 2003

## 2012-10-29 NOTE — ED Notes (Signed)
Pt c/o increased pain in back between shoulder blades and onset of HA. Pt A&O, resting w/ head on pillow, in NAD.

## 2013-06-07 ENCOUNTER — Emergency Department (HOSPITAL_BASED_OUTPATIENT_CLINIC_OR_DEPARTMENT_OTHER): Payer: Self-pay

## 2013-06-07 ENCOUNTER — Encounter (HOSPITAL_BASED_OUTPATIENT_CLINIC_OR_DEPARTMENT_OTHER): Payer: Self-pay

## 2013-06-07 ENCOUNTER — Emergency Department (HOSPITAL_BASED_OUTPATIENT_CLINIC_OR_DEPARTMENT_OTHER)
Admission: EM | Admit: 2013-06-07 | Discharge: 2013-06-07 | Disposition: A | Discharge status: Home / Self Care | Payer: Self-pay | Attending: Emergency Medicine | Admitting: Emergency Medicine

## 2013-06-07 DIAGNOSIS — M25519 Pain in unspecified shoulder: Secondary | ICD-10-CM | POA: Insufficient documentation

## 2013-06-07 DIAGNOSIS — M25511 Pain in right shoulder: Secondary | ICD-10-CM

## 2013-06-07 DIAGNOSIS — N200 Calculus of kidney: Secondary | ICD-10-CM | POA: Insufficient documentation

## 2013-06-07 DIAGNOSIS — Z8659 Personal history of other mental and behavioral disorders: Secondary | ICD-10-CM | POA: Insufficient documentation

## 2013-06-07 DIAGNOSIS — Z3202 Encounter for pregnancy test, result negative: Secondary | ICD-10-CM | POA: Insufficient documentation

## 2013-06-07 DIAGNOSIS — F17200 Nicotine dependence, unspecified, uncomplicated: Secondary | ICD-10-CM | POA: Insufficient documentation

## 2013-06-07 DIAGNOSIS — Z8719 Personal history of other diseases of the digestive system: Secondary | ICD-10-CM | POA: Insufficient documentation

## 2013-06-07 LAB — URINE MICROSCOPIC-ADD ON

## 2013-06-07 LAB — URINALYSIS, ROUTINE W REFLEX MICROSCOPIC
Ketones, ur: 15 mg/dL — AB
Leukocytes, UA: NEGATIVE
Nitrite: NEGATIVE
Specific Gravity, Urine: 1.037 — ABNORMAL HIGH (ref 1.005–1.030)
pH: 6 (ref 5.0–8.0)

## 2013-06-07 MED ORDER — HYDROCODONE-ACETAMINOPHEN 5-325 MG PO TABS: 1.0000 | ORAL_TABLET | Freq: Three times a day (TID) | ORAL | Status: DC | PRN

## 2013-06-07 MED ORDER — CYCLOBENZAPRINE HCL 10 MG PO TABS: 10.0000 mg | ORAL_TABLET | Freq: Two times a day (BID) | ORAL | Status: DC | PRN

## 2013-06-07 MED ORDER — NAPROXEN 500 MG PO TABS: 500.0000 mg | ORAL_TABLET | Freq: Two times a day (BID) | ORAL | Status: DC

## 2013-06-07 NOTE — ED Notes (Signed)
Pt reports last night with onset of right shoulder pain with radiation to right shoulderblade area.  Denies injury or any additional symptoms.

## 2013-06-07 NOTE — ED Provider Notes (Signed)
CSN: [id number]     Arrival date & time 06/07/13  1655 History     First MD Initiated Contact with Patient 06/07/13 1710     Chief Complaint  Patient presents with  . Shoulder Pain   (Consider location/radiation/quality/duration/timing/severity/associated sxs/prior Treatment) The history is provided by the patient. No language interpreter was used.  Sherry Blanchard is a 32 year old female presenting to the emergency department with right shoulder pain that started last night. Patient described the right shoulder pain to be an aching sensation that started last night, reported that should the pain has increased today with an aching that is deeper in the right shoulder with radiation down the mid back. Patient reported that she is taking Tylenol Motrin, used warm compressions with little relief. Stated that the pain worsens when she comes in contact with cold air. Denied fall, injury, heavy lifting, fever, chills, neck pain, neck stiffness, numbness, tingling, weakness, chest pain, shortness of breath, difficulty breathing. PCP none  Past Medical History  Diagnosis Date  . Blood transfusion   . Acid reflux   . Anxiety    Past Surgical History  Procedure Laterality Date  . Ankle surgery    . Tubal ligation     No family history on file. History  Substance Use Topics  . Smoking status: Current Every Day Smoker    Types: Cigars  . Smokeless tobacco: Not on file  . Alcohol Use: Yes     Comment: occasional   OB History   Grav Para Term Preterm Abortions TAB SAB Ect Mult Living                 Review of Systems  Constitutional: Negative for fever and chills.  HENT: Negative for sore throat, trouble swallowing, neck pain and neck stiffness.   Eyes: Negative for visual disturbance.  Respiratory: Negative for chest tightness and shortness of breath.   Cardiovascular: Negative for chest pain.  Gastrointestinal: Negative for abdominal pain.  Musculoskeletal: Positive for arthralgias  (Right shoulder pain).  Neurological: Negative for dizziness, weakness, numbness and headaches.  All other systems reviewed and are negative.    Allergies  Review of patient's allergies indicates no known allergies.  Home Medications   Current Outpatient Rx  Name  Route  Sig  Dispense  Refill  . acetaminophen-codeine (TYLENOL #3) 300-30 MG per tablet   Oral   Take 1 tablet by mouth 2 (two) times daily.           . cyclobenzaprine (FLEXERIL) 10 MG tablet   Oral   Take 1 tablet (10 mg total) by mouth 2 (two) times daily as needed for muscle spasms.   20 tablet   0   . diphenhydrAMINE (BENADRYL) 25 mg capsule   Oral   Take 25 mg by mouth as needed. Sleep          . guaiFENesin (ROBITUSSIN) 100 MG/5ML liquid   Oral   Take 5-10 mLs (100-200 mg total) by mouth every 4 (four) hours as needed for cough.   60 mL   0   . HYDROcodone-acetaminophen (NORCO) 5-325 MG per tablet   Oral   Take 1 tablet by mouth every 8 (eight) hours as needed for pain.   5 tablet   0   . HYDROcodone-acetaminophen (NORCO/VICODIN) 5-325 MG per tablet      Take 1-2 tabs po q 6 hours PRN pain   15 tablet   0   . HYDROcodone-homatropine (HYCODAN) 5-1.5 MG/5ML syrup  Oral   Take 5 mLs by mouth every 4 (four) hours as needed for cough.   120 mL   0   . naproxen (NAPROSYN) 500 MG tablet   Oral   Take 1 tablet (500 mg total) by mouth 2 (two) times daily.   30 tablet   0   . EXPIRED: promethazine (PHENERGAN) 25 MG tablet   Oral   Take 1 tablet (25 mg total) by mouth every 6 (six) hours as needed for nausea.   30 tablet   0    BP 141/70  Pulse 91  Temp(Src) 98.2 F (36.8 C) (Oral)  Resp 18  Ht 5\' 8"  (1.727 m)  Wt 178 lb (80.74 kg)  BMI 27.07 kg/m2  SpO2 100%  LMP 06/04/2013 Physical Exam  Nursing note and vitals reviewed. Constitutional: She is oriented to person, place, and time. She appears well-developed and well-nourished. No distress.  HENT:  Head: Normocephalic and  atraumatic.  Eyes: Conjunctivae and EOM are normal. Pupils are equal, round, and reactive to light. Right eye exhibits no discharge.  Neck: Normal range of motion. Neck supple.  Negative neck stiffness Negative pain upon palpation cervical spine  Cardiovascular: Normal rate, regular rhythm and normal heart sounds.  Exam reveals no friction rub.   No murmur heard. Pulses:      Radial pulses are 2+ on the right side, and 2+ on the left side.  Pulmonary/Chest: Effort normal and breath sounds normal. No respiratory distress. She has no wheezes. She has no rales.  Abdominal: Soft. Bowel sounds are normal. She exhibits no distension. There is no tenderness. There is no rebound and no guarding.  Mild right-sided CVA tenderness noted Mild Murphy's sign Negative psoas and obturator  Musculoskeletal: Normal range of motion.  Full range of motion to upper extremities bilaterally Full range of motion to right shoulder-full extension, flexion, inversion, eversion, abduction, abduction Negative drop arm Negative deformities, sunken in appearance, tenting, abnormalities noted to the right shoulder  Lymphadenopathy:    She has no cervical adenopathy.  Neurological: She is alert and oriented to person, place, and time. No cranial nerve deficit. She exhibits normal muscle tone. Coordination normal.  Cranial nerves III-XII grossly intact Strength 5+/5+ with resistance to upper extremities bilaterally-equal Sensation intact  Skin: Skin is warm and dry. No rash noted. She is not diaphoretic. No erythema.  Psychiatric: She has a normal mood and affect. Her behavior is normal. Thought content normal.    ED Course   Procedures (including critical care time)  Full range of motion to the right shoulder. Upon physical exam patient reported that she been having mild discomfort to the back - patient pointed near the right flank region, positive CVA tenderness noted-ruling out of nephrolithiasis, pyelonephritis.  Patient reported that she does have a problem with abdominal discomfort, reported that the right shoulder pain as a deep dull ache-possible referred pain from gallbladder, need to rule out gallstones.  Labs Reviewed  URINALYSIS, ROUTINE W REFLEX MICROSCOPIC - Abnormal; Notable for the following:    Color, Urine AMBER (*)    APPearance CLOUDY (*)    Specific Gravity, Urine 1.037 (*)    Hgb urine dipstick LARGE (*)    Bilirubin Urine SMALL (*)    Ketones, ur 15 (*)    Protein, ur 30 (*)    All other components within normal limits  URINE MICROSCOPIC-ADD ON - Abnormal; Notable for the following:    Squamous Epithelial / LPF MANY (*)  Bacteria, UA FEW (*)    Crystals CA OXALATE CRYSTALS (*)    All other components within normal limits  PREGNANCY, URINE   Dg Shoulder Right  06/07/2013   *RADIOLOGY REPORT*  Clinical Data: Right shoulder pain, no trauma  RIGHT SHOULDER - 2+ VIEW  Comparison:  None.  Findings:  There is no evidence of fracture or dislocation.  There is no evidence of arthropathy or other focal bone abnormality. Soft tissues are unremarkable.  IMPRESSION: Negative.   Original Report Authenticated By: Christiana Pellant, M.D.   US Abdomen Complete  06/07/2013   *RADIOLOGY REPORT*  Clinical Data:  Atypical right-sided chest pain.  ABDOMINAL ULTRASOUND COMPLETE  Comparison:  None.  Findings:  Gallbladder:  Gallbladder is incompletely distended, however there is no evidence of gallstones, gallbladder wall thickening, or pericholecystic fluid.  Common Bile Duct:  Within normal limits in caliber. Measures 2 mm in diameter.  Liver: No focal mass lesion identified.  Within normal limits in parenchymal echogenicity.  IVC:  Appears normal.  Pancreas:  No abnormality identified.  Spleen:  Within normal limits in size and echotexture.  Right kidney:  Normal in size and parenchymal echogenicity.  No evidence of mass or hydronephrosis.  Left kidney:  Normal in size and parenchymal echogenicity.  No  evidence of mass or hydronephrosis.  Abdominal Aorta:  No aneurysm identified.  IMPRESSION: Negative abdominal ultrasound.   Original Report Authenticated By: Myles Rosenthal, M.D.   1. Shoulder pain, right   2. Kidney stones     MDM  Patient presenting to the emergency department with the right shoulder pain that started last night. Alert and oriented. Full range of motion to the right shoulder without difficulty. Strength intact to the right upper extremities. Right-sided CVA tenderness noted upon exam. Negative acute abdomen, negative peritoneal signs. Urine noted large amount of hemoglobin (patient currently menstruating), oxalate crystals present. Ultrasound negative for hydronephrosis of the kidneys, negative gallstones noted, normal ultrasound. Imaging of the right shoulder negative findings.  Suspicion of shoulder pain to be musculoskeletal in nature -possible strain. Possibility of kidney stones-negative hydronephrosis noted on ultrasound. Patient stable, afebrile. Discharged patient with pain medications, anti-inflammatories, muscle relaxers. Discussed with patient pain medications the course, precautions, disposal technique. Educated patient on symptoms for nephrolithiasis. Referred patient to health and wellness center. Discussed with patient to rest and stay hydrated. Discussed with patient to avoid any strenuous activity. Discussed with patient to continue to monitor symptoms and if symptoms are to worsen or change to report back to emergency department-strict return instructions given. Patient agreed to plan of care, understood, all questions answered.  Raymon Mutton, PA-C 06/08/13 2254

## 2013-06-07 NOTE — ED Notes (Signed)
Limited ROM at right shoulder, no deformity, happen since last night. No swelling noted as well.

## 2013-06-09 NOTE — ED Provider Notes (Signed)
Medical screening examination/treatment/procedure(s) were performed by non-physician practitioner and as supervising physician I was immediately available for consultation/collaboration.   Audree Camel, MD 06/09/13 1009

## 2013-09-19 ENCOUNTER — Encounter (HOSPITAL_BASED_OUTPATIENT_CLINIC_OR_DEPARTMENT_OTHER): Payer: Self-pay | Admitting: Emergency Medicine

## 2013-09-19 ENCOUNTER — Emergency Department (HOSPITAL_BASED_OUTPATIENT_CLINIC_OR_DEPARTMENT_OTHER)
Admission: EM | Admit: 2013-09-19 | Discharge: 2013-09-19 | Disposition: A | Discharge status: Home / Self Care | Payer: Medicaid Other | Attending: Emergency Medicine | Admitting: Emergency Medicine

## 2013-09-19 DIAGNOSIS — F17200 Nicotine dependence, unspecified, uncomplicated: Secondary | ICD-10-CM | POA: Insufficient documentation

## 2013-09-19 DIAGNOSIS — Z8659 Personal history of other mental and behavioral disorders: Secondary | ICD-10-CM | POA: Insufficient documentation

## 2013-09-19 DIAGNOSIS — K1370 Unspecified lesions of oral mucosa: Secondary | ICD-10-CM | POA: Insufficient documentation

## 2013-09-19 NOTE — ED Notes (Signed)
Pt reports a "bump" on her tongue x 1 week.

## 2013-09-19 NOTE — ED Notes (Signed)
MD at bedside. 

## 2013-09-19 NOTE — ED Notes (Signed)
Small raised area on lateral aspect of tongue x 1 week.  Pt reports no pain, denies injury or other symptoms.

## 2013-09-19 NOTE — ED Provider Notes (Signed)
CSN: [id number]     Arrival date & time 09/19/13  0913 History   First MD Initiated Contact with Patient 09/19/13 [phone]     Chief Complaint  Patient presents with  . Mouth Lesions   (Consider location/radiation/quality/duration/timing/severity/associated sxs/prior Treatment) Patient is a 32 y.o. female presenting with mouth sores.  Mouth Lesions Associated symptoms: no fever and no sore throat     32 year old female here with mouth lesion for one week. States she noticed the lesion first and when she was brushing her teeth. She does not remember biting her tongue or hurting that area at all. She denies fevers, chills, sweats, bleeding at the site, pain, or enlargement of the lesion at all. She states that she's been reading on the Internet and says religious without cancer as she smokes 3 cigars daily for the past 3 years. She denies history of other sores like this and has not seen any other source for this currently. It is not affecting the way she eats or drinks.   Past Medical History  Diagnosis Date  . Blood transfusion   . Acid reflux   . Anxiety    Past Surgical History  Procedure Laterality Date  . Ankle surgery    . Tubal ligation     No family history on file. History  Substance Use Topics  . Smoking status: Current Every Day Smoker    Types: Cigars  . Smokeless tobacco: Not on file  . Alcohol Use: Yes     Comment: occasional   OB History   Grav Para Term Preterm Abortions TAB SAB Ect Mult Living                 Review of Systems  Constitutional: Negative for fever, chills and appetite change.  HENT: Positive for mouth sores. Negative for sore throat.   Respiratory: Negative for cough and shortness of breath.   Cardiovascular: Negative for chest pain.  Gastrointestinal: Negative for abdominal pain.  Neurological: Negative for headaches.  All other systems reviewed and are negative.    Allergies  Review of patient's allergies indicates no known  allergies.  Home Medications  No current outpatient prescriptions on file. BP 151/70  Pulse 88  Temp(Src) 98.6 F (37 C) (Oral)  Resp 16  Ht 5\' 8"  (1.727 m)  Wt 170 lb (77.111 kg)  BMI 25.85 kg/m2  SpO2 100%  LMP 09/16/2013 Physical Exam  Nursing note and vitals reviewed. Constitutional: She is oriented to person, place, and time. She appears well-developed and well-nourished. No distress.  HENT:  Head: Normocephalic and atraumatic.  Mouth/Throat: Oral lesions present. No trismus in the jaw. Normal dentition. No dental abscesses, lacerations or dental caries.    5-7 mm firm non ulcerated solid white to flesh colored lesion at the area marked in the diagram. Non tender to palpation and no surrounding induration.   Eyes: EOM are normal. Pupils are equal, round, and reactive to light.  Neck: Neck supple.  Cardiovascular: Normal rate, regular rhythm and normal heart sounds.   No murmur heard. Pulmonary/Chest: Effort normal and breath sounds normal.  Abdominal: Soft. Bowel sounds are normal. There is no tenderness. There is no guarding.  Musculoskeletal: She exhibits no edema.  Lymphadenopathy:    She has no cervical adenopathy.  Neurological: She is alert and oriented to person, place, and time.  Skin: Skin is warm and dry. She is not diaphoretic.  Psychiatric: She has a normal mood and affect.    ED Course  Procedures (  including critical care time) Labs Review Labs Reviewed - No data to display Imaging Review No results found.  EKG Interpretation   None       MDM   1. Mouth lesion    32 y/o female with Hx of smoking here with nodule on her tongue that she is concerned may be cancer. She denies any enlargement, erythema, or pain at the site. There are no signs of infection on exam. Given that it has only been there for 7 days I don't feel that biopsy of the site is necessary. Discussed red flags with her and encouraged establishing a PCP to follow up with. Discussed  that she may need to see a dermatologist for Bx if it does not resolve.   Return for worsening symptoms.   Murtis Sink, MD [facility] Resident, PGY-2 09/19/2013, 9:53 AM       Elenora Gamma, MD 09/19/13 [phone]

## 2013-09-20 NOTE — ED Provider Notes (Signed)
Medical screening examination/treatment/procedure(s) were conducted as a shared visit with resident-physician practitioner(s) and myself.  I personally evaluated the patient during the encounter.  Pt is a 32 y.o. female with pmhx as above presenting with nontender lesion of tongue for 1 week.  Pt found to have no other intraoral lesions and is well appearing on PE.  I am unsure of cause of lesion, but doubt acute infection or cancer and feel she is safe for outpt f/u if lesion does not resolve.   Shanna Cisco, MD 09/20/13 [phone]

## 2013-09-26 ENCOUNTER — Emergency Department (HOSPITAL_BASED_OUTPATIENT_CLINIC_OR_DEPARTMENT_OTHER)
Admission: EM | Admit: 2013-09-26 | Discharge: 2013-09-26 | Disposition: A | Discharge status: Home / Self Care | Payer: Medicaid Other | Attending: Emergency Medicine | Admitting: Emergency Medicine

## 2013-09-26 ENCOUNTER — Encounter (HOSPITAL_BASED_OUTPATIENT_CLINIC_OR_DEPARTMENT_OTHER): Payer: Self-pay | Admitting: Emergency Medicine

## 2013-09-26 DIAGNOSIS — Z8659 Personal history of other mental and behavioral disorders: Secondary | ICD-10-CM | POA: Insufficient documentation

## 2013-09-26 DIAGNOSIS — Z8719 Personal history of other diseases of the digestive system: Secondary | ICD-10-CM | POA: Insufficient documentation

## 2013-09-26 DIAGNOSIS — F17200 Nicotine dependence, unspecified, uncomplicated: Secondary | ICD-10-CM | POA: Insufficient documentation

## 2013-09-26 DIAGNOSIS — K1370 Unspecified lesions of oral mucosa: Secondary | ICD-10-CM | POA: Insufficient documentation

## 2013-09-26 NOTE — ED Notes (Signed)
Small white lesion on lateral left side of pts tongue.  Denies pain.  Reports it has been there for 3 weeks.

## 2013-09-26 NOTE — ED Provider Notes (Signed)
Medical screening examination/treatment/procedure(s) were performed by non-physician practitioner and as supervising physician I was immediately available for consultation/collaboration.  EKG Interpretation   None         Gwyneth Sprout, MD 09/26/13 1025

## 2013-09-26 NOTE — ED Provider Notes (Signed)
CSN: [id number]     Arrival date & time 09/26/13  4098 History   First MD Initiated Contact with Patient 09/26/13 0957     Chief Complaint  Patient presents with  . Mouth Lesions   (Consider location/radiation/quality/duration/timing/severity/associated sxs/prior Treatment) HPI Comments: Pt is here with complaints of a mass on the left side of her tongue:pt states that she was seen here a weak ago and she was given follow up but she doesn't have insurance:denies redness or drainage to the area  The history is provided by the patient. No language interpreter was used.    Past Medical History  Diagnosis Date  . Blood transfusion   . Acid reflux   . Anxiety    Past Surgical History  Procedure Laterality Date  . Ankle surgery    . Tubal ligation     History reviewed. No pertinent family history. History  Substance Use Topics  . Smoking status: Current Every Day Smoker    Types: Cigars  . Smokeless tobacco: Not on file  . Alcohol Use: Yes     Comment: occasional   OB History   Grav Para Term Preterm Abortions TAB SAB Ect Mult Living                 Review of Systems  Constitutional: Negative.   Respiratory: Negative.   Cardiovascular: Negative.     Allergies  Review of patient's allergies indicates no known allergies.  Home Medications  No current outpatient prescriptions on file. BP 161/77  Pulse 82  Temp(Src) 97.7 F (36.5 C) (Oral)  Resp 16  SpO2 100%  LMP 09/16/2013 Physical Exam  Nursing note and vitals reviewed. Constitutional: She appears well-developed and well-nourished.  HENT:  Small firm round area to the left lateral tongue:no redness or drainage noted  Cardiovascular: Normal rate and regular rhythm.   Pulmonary/Chest: Effort normal and breath sounds normal.    ED Course  Procedures (including critical care time) Labs Review Labs Reviewed - No data to display Imaging Review No results found.  EKG Interpretation   None       MDM    1. Mouth lesion    Pt given follow up for biopsy    Teressa Lower, NP 09/26/13 1023

## 2015-01-27 IMAGING — US US ABDOMEN COMPLETE
1 series · 14 of 25 positions shown · non-contrast
Comparison: None.

CLINICAL DATA: Atypical right-sided chest pain.

ABDOMINAL ULTRASOUND COMPLETE

[Series 1: us abdomen complete · 0.32mm/px · 14 of 65 slices shown]
[im 1/65]
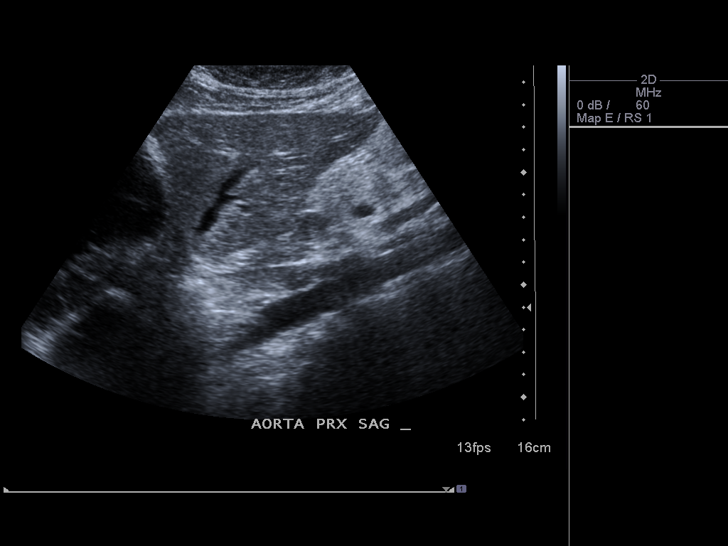
[im 6/65]
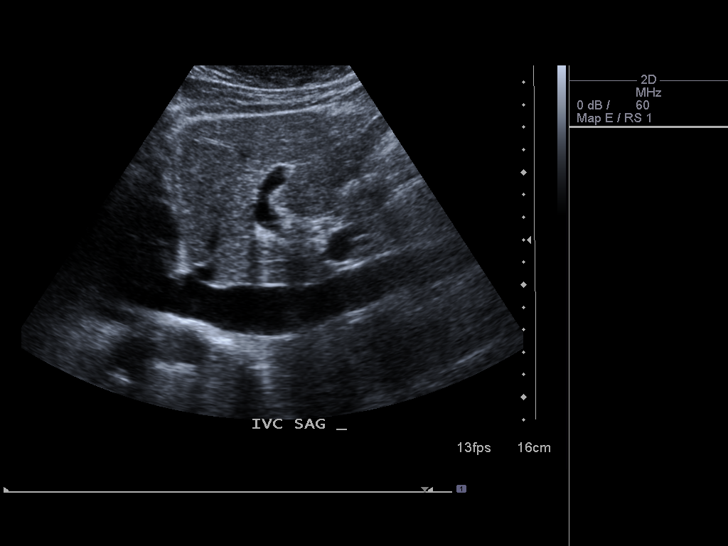
[im 11/65]
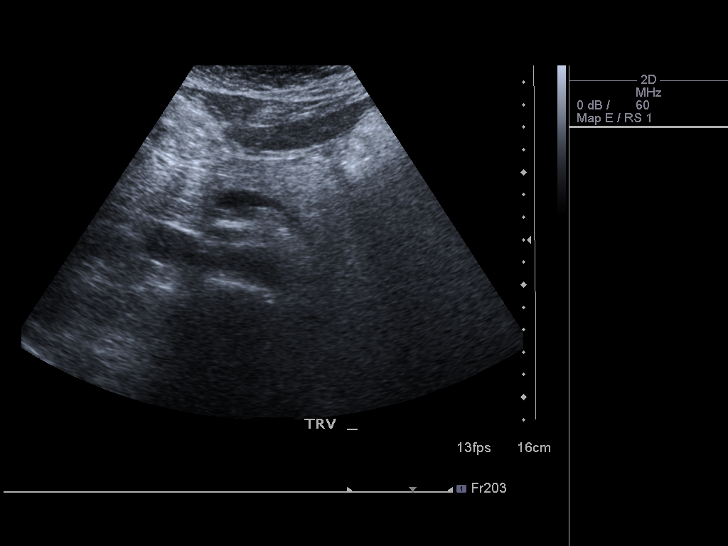
[im 17/65]
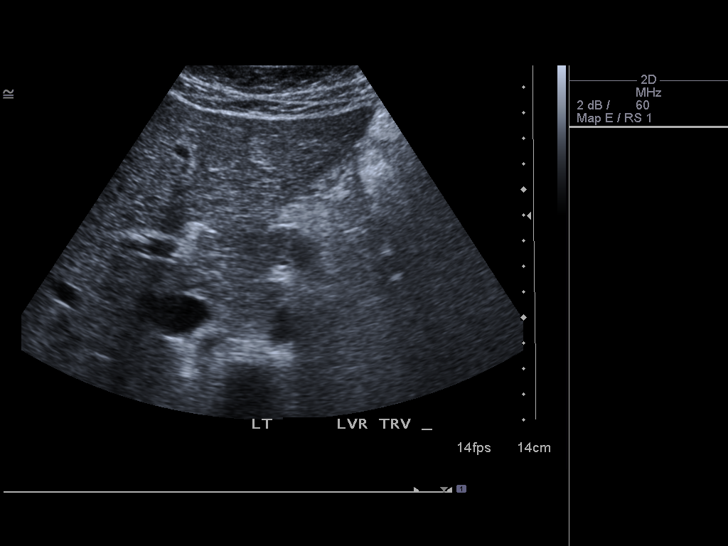
[im 22/65]
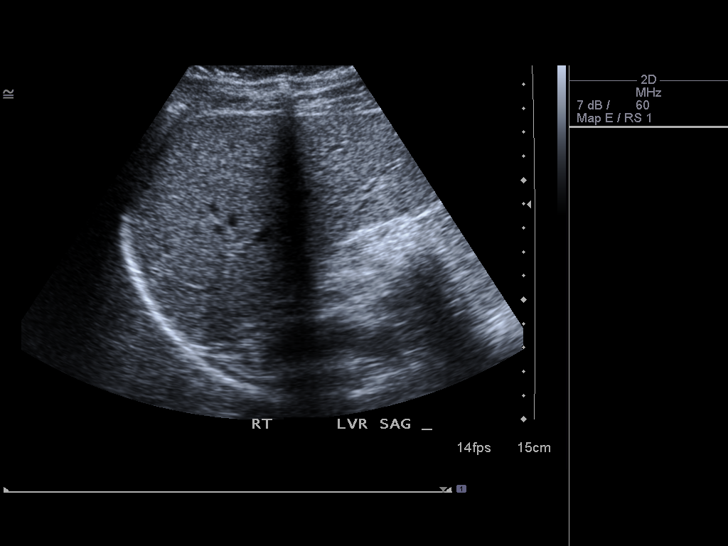
[im 25/65]
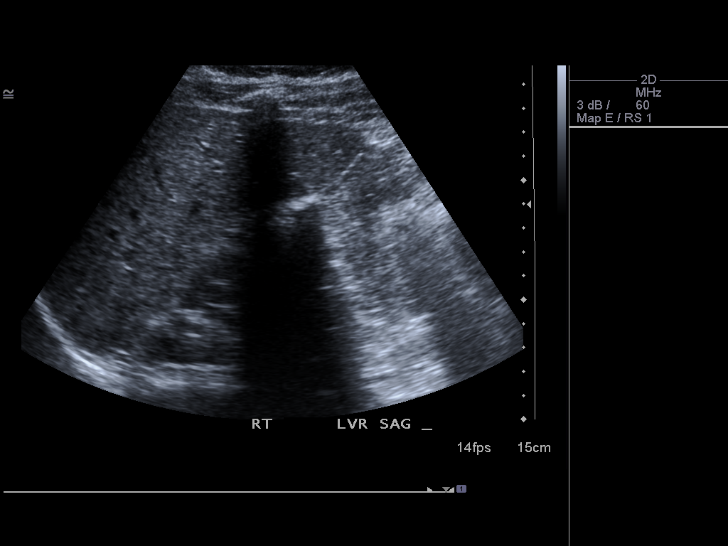
[im 30/65]
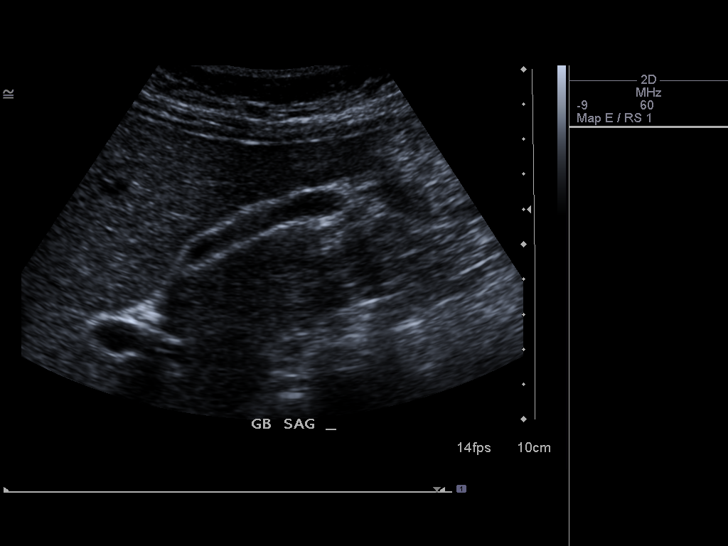
[im 35/65]
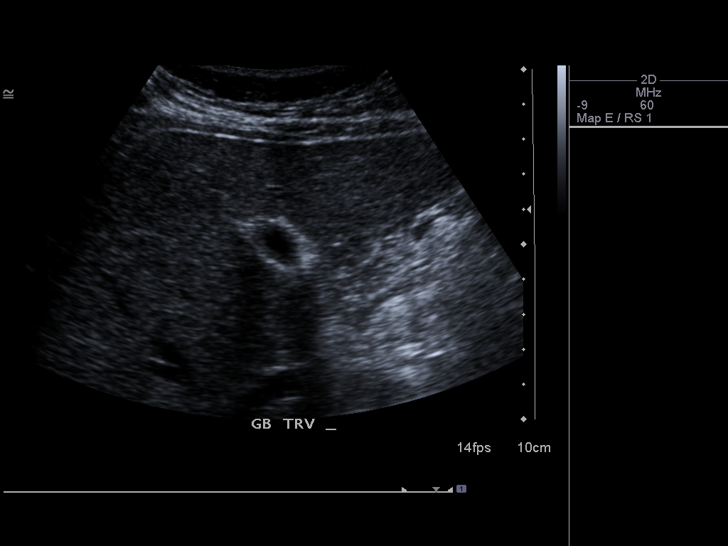
[im 41/65]
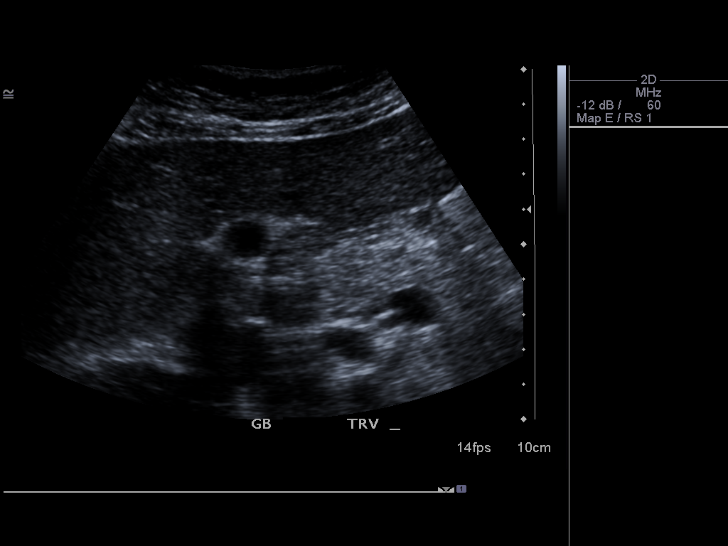
[im 43/65]
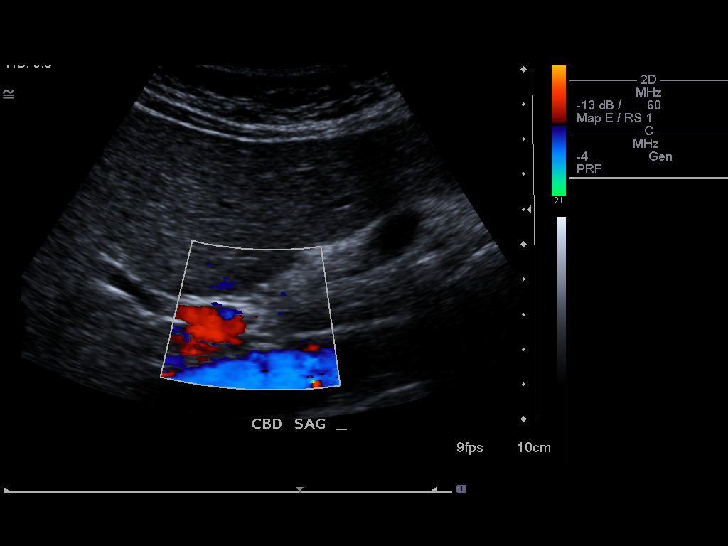
[im 49/65]
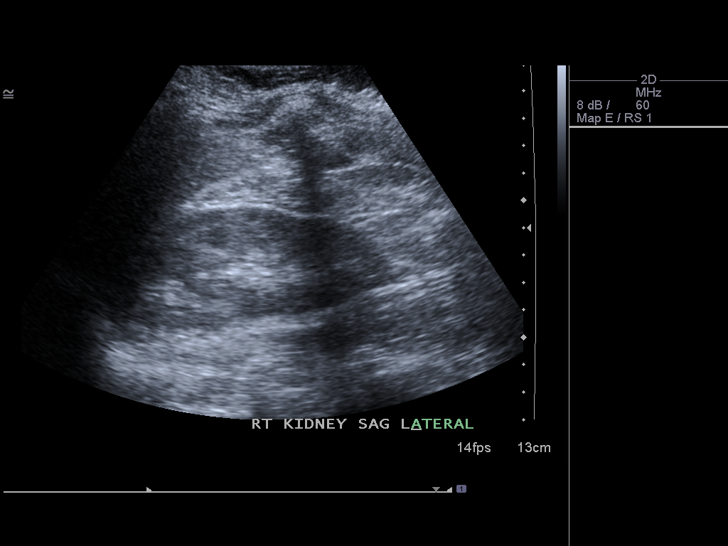
[im 54/65]
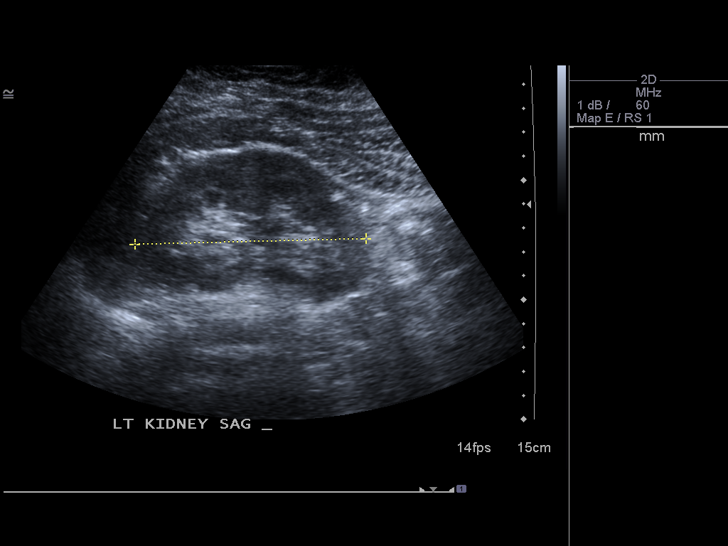
[im 59/65]
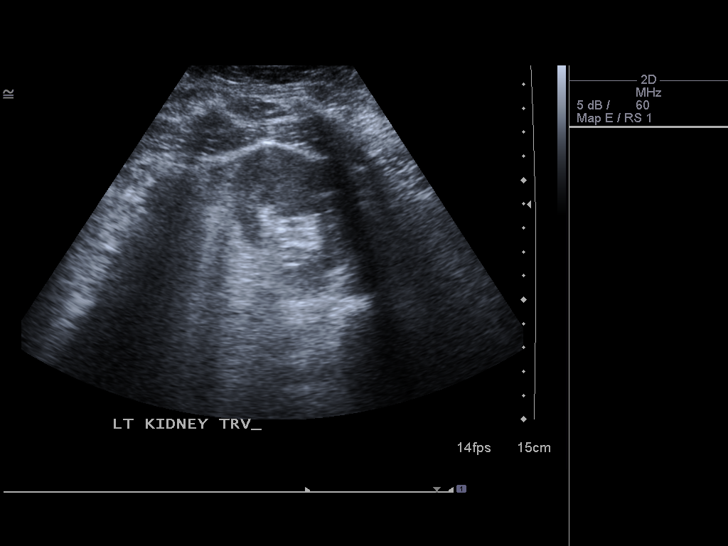
[im 65/65]
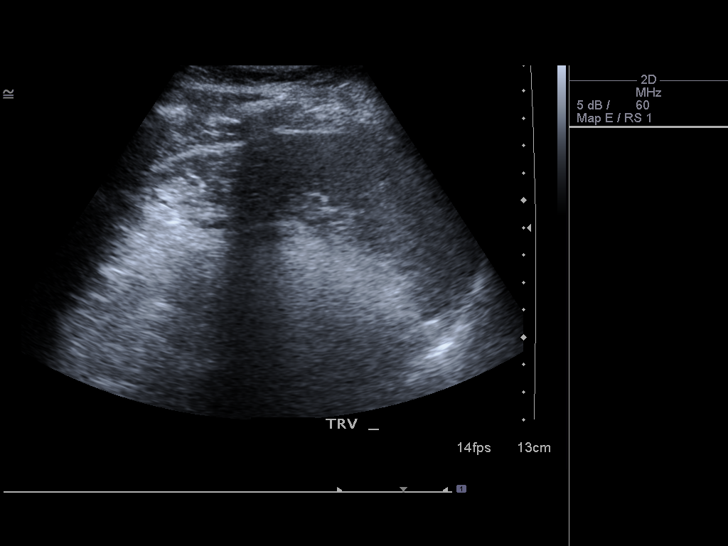

[14 of 25 positions shown; findings below may reference images not displayed]

FINDINGS: Gallbladder:  Gallbladder is incompletely distended, however there
is no evidence of gallstones, gallbladder wall thickening, or
pericholecystic fluid.

Common Bile Duct:  Within normal limits in caliber. Measures 2 mm
in diameter.

Liver: No focal mass lesion identified.  Within normal limits in
parenchymal echogenicity.

IVC:  Appears normal.

Pancreas:  No abnormality identified.

Spleen:  Within normal limits in size and echotexture.

Right kidney:  Normal in size and parenchymal echogenicity.  No
evidence of mass or hydronephrosis.

Left kidney:  Normal in size and parenchymal echogenicity.  No
evidence of mass or hydronephrosis.

Abdominal Aorta:  No aneurysm identified.
IMPRESSION: Negative abdominal ultrasound.

## 2015-09-10 ENCOUNTER — Emergency Department (HOSPITAL_BASED_OUTPATIENT_CLINIC_OR_DEPARTMENT_OTHER): Payer: BC Managed Care – PPO

## 2015-09-10 ENCOUNTER — Encounter (HOSPITAL_BASED_OUTPATIENT_CLINIC_OR_DEPARTMENT_OTHER): Payer: Self-pay

## 2015-09-10 ENCOUNTER — Emergency Department (HOSPITAL_BASED_OUTPATIENT_CLINIC_OR_DEPARTMENT_OTHER)
Admission: EM | Admit: 2015-09-10 | Discharge: 2015-09-10 | Disposition: A | Discharge status: Home / Self Care | Payer: BC Managed Care – PPO | Attending: Emergency Medicine | Admitting: Emergency Medicine

## 2015-09-10 DIAGNOSIS — K219 Gastro-esophageal reflux disease without esophagitis: Secondary | ICD-10-CM | POA: Diagnosis not present

## 2015-09-10 DIAGNOSIS — Z8659 Personal history of other mental and behavioral disorders: Secondary | ICD-10-CM | POA: Insufficient documentation

## 2015-09-10 DIAGNOSIS — Z8719 Personal history of other diseases of the digestive system: Secondary | ICD-10-CM | POA: Diagnosis not present

## 2015-09-10 DIAGNOSIS — R079 Chest pain, unspecified: Secondary | ICD-10-CM | POA: Diagnosis present

## 2015-09-10 DIAGNOSIS — F17210 Nicotine dependence, cigarettes, uncomplicated: Secondary | ICD-10-CM | POA: Insufficient documentation

## 2015-09-10 DIAGNOSIS — D649 Anemia, unspecified: Secondary | ICD-10-CM | POA: Insufficient documentation

## 2015-09-10 HISTORY — DX: Encounter for other specified aftercare: Z51.89

## 2015-09-10 LAB — BASIC METABOLIC PANEL
Anion gap: 5 (ref 5–15)
BUN: 6 mg/dL (ref 6–20)
CHLORIDE: 109 mmol/L (ref 101–111)
CO2: 25 mmol/L (ref 22–32)
Calcium: 8.4 mg/dL — ABNORMAL LOW (ref 8.9–10.3)
Creatinine, Ser: 0.81 mg/dL (ref 0.44–1.00)
GFR calc non Af Amer: >60 mL/min (ref >60)
Glucose, Bld: 100 mg/dL — ABNORMAL HIGH (ref 65–99)
POTASSIUM: 3.7 mmol/L (ref 3.5–5.1)
SODIUM: 139 mmol/L (ref 135–145)

## 2015-09-10 LAB — CBC WITH DIFFERENTIAL/PLATELET
Basophils Absolute: 0 10*3/uL (ref 0.0–0.1)
Basophils Relative: 0 %
Eosinophils Absolute: 0.2 10*3/uL (ref 0.0–0.7)
Eosinophils Relative: 2 %
HEMATOCRIT: 28 % — AB (ref 36.0–46.0)
HEMOGLOBIN: 8 g/dL — AB (ref 12.0–15.0)
LYMPHS ABS: 4 10*3/uL (ref 0.7–4.0)
LYMPHS PCT: 43 %
MCH: 21.7 pg — AB (ref 26.0–34.0)
MCHC: 28.6 g/dL — AB (ref 30.0–36.0)
MCV: 75.9 fL — AB (ref 78.0–100.0)
MONOS PCT: 6 %
Monocytes Absolute: 0.5 10*3/uL (ref 0.1–1.0)
NEUTROS PCT: 49 %
Neutro Abs: 4.6 10*3/uL (ref 1.7–7.7)
Platelets: 464 10*3/uL — ABNORMAL HIGH (ref 150–400)
RBC: 3.69 MIL/uL — AB (ref 3.87–5.11)
RDW: 20.4 % — ABNORMAL HIGH (ref 11.5–15.5)
WBC: 9.3 10*3/uL (ref 4.0–10.5)

## 2015-09-10 LAB — TROPONIN I: Troponin I: <0.03 ng/mL (ref <0.031)

## 2015-09-10 MED ORDER — FAMOTIDINE 20 MG PO TABS: 20.0000 mg | ORAL_TABLET | Freq: Two times a day (BID) | ORAL | Status: DC

## 2015-09-10 MED ORDER — GI COCKTAIL ~~LOC~~
30.0000 mL | Freq: Once | ORAL | Status: AC
  Administered 2015-09-10: 30 mL via ORAL
  Filled 2015-09-10: qty 30

## 2015-09-10 NOTE — ED Notes (Signed)
Cp since Saturday-worse when she eats-feels like food gets stuck-NAD

## 2015-09-10 NOTE — Discharge Instructions (Signed)
Anemia, Nonspecific Anemia is a condition in which the concentration of red blood cells or hemoglobin in the blood is below normal. Hemoglobin is a substance in red blood cells that carries oxygen to the tissues of the body. Anemia results in not enough oxygen reaching these tissues.  CAUSES  Common causes of anemia include:   Excessive bleeding. Bleeding may be internal or external. This includes excessive bleeding from periods (in women) or from the intestine.   Poor nutrition.   Chronic kidney, thyroid, and liver disease.  Bone marrow disorders that decrease red blood cell production.  Cancer and treatments for cancer.  HIV, AIDS, and their treatments.  Spleen problems that increase red blood cell destruction.  Blood disorders.  Excess destruction of red blood cells due to infection, medicines, and autoimmune disorders. SIGNS AND SYMPTOMS   Minor weakness.   Dizziness.   Headache.  Palpitations.   Shortness of breath, especially with exercise.   Paleness.  Cold sensitivity.  Indigestion.  Nausea.  Difficulty sleeping.  Difficulty concentrating. Symptoms may occur suddenly or they may develop slowly.  DIAGNOSIS  Additional blood tests are often needed. These help your health care provider determine the best treatment. Your health care provider will check your stool for blood and look for other causes of blood loss.  TREATMENT  Treatment varies depending on the cause of the anemia. Treatment can include:   Supplements of iron, vitamin 123456, or folic acid.   Hormone medicines.   A blood transfusion. This may be needed if blood loss is severe.   Hospitalization. This may be needed if there is significant continual blood loss.   Dietary changes.  Spleen removal. HOME CARE INSTRUCTIONS Keep all follow-up appointments. It often takes many weeks to correct anemia, and having your health care provider check on your condition and your response to  treatment is very important. SEEK IMMEDIATE MEDICAL CARE IF:   You develop extreme weakness, shortness of breath, or chest pain.   You become dizzy or have trouble concentrating.  You develop heavy vaginal bleeding.   You develop a rash.   You have bloody or black, tarry stools.   You faint.   You vomit up blood.   You vomit repeatedly.   You have abdominal pain.  You have a fever or persistent symptoms for more than 2-3 days.   You have a fever and your symptoms suddenly get worse.   You are dehydrated.  MAKE SURE YOU:  Understand these instructions.  Will watch your condition.  Will get help right away if you are not doing well or get worse.   This information is not intended to replace advice given to you by your health care provider. Make sure you discuss any questions you have with your health care provider.   Document Released: 11/20/2004 Document Revised: 06/15/2013 Document Reviewed: 04/08/2013 Elsevier Interactive Patient Education 2016 Elsevier [employer]  Esophagitis Esophagitis is inflammation of the esophagus. The esophagus is the tube that carries food and liquids from your mouth to your stomach. Esophagitis can cause soreness or pain in the esophagus. This condition can make it difficult and painful to swallow.  CAUSES Most causes of esophagitis are not serious. Common causes of this condition include:  Gastroesophageal reflux disease (GERD). This is when stomach contents move back up into the esophagus (reflux).  Repeated vomiting.  An allergic-type reaction, especially caused by food allergies (eosinophilic esophagitis).  Injury to the esophagus by swallowing large pills with or without water, or swallowing  certain types of medicines.  Swallowing (ingesting) harmful chemicals, such as household cleaning products.  Heavy alcohol use.  An infection of the esophagus.This most often occurs in people who have a weakened immune  system.  Radiation or chemotherapy treatment for cancer.  Certain diseases such as sarcoidosis, Crohn disease, and scleroderma. SYMPTOMS Symptoms of this condition include:  Difficult or painful swallowing.  Pain with swallowing acidic liquids, such as citrus juices.  Pain with burping.  Chest pain.  Difficulty breathing.  Nausea.  Vomiting.  Pain in the abdomen.  Weight loss.  Ulcers in the mouth.  Patches of white material in the mouth (candidiasis).  Fever.  Coughing up blood or vomiting blood.  Stool that is black, tarry, or bright red. DIAGNOSIS Your health care provider will take a medical history and perform a physical exam. You may also have other tests, including:  An endoscopy to examine your stomach and esophagus with a small camera.  A test that measures the acidity level in your esophagus.  A test that measures how much pressure is on your esophagus.  A barium swallow or modified barium swallow to show the shape, size, and functioning of your esophagus.  Allergy tests. TREATMENT Treatment for this condition depends on the cause of your esophagitis. In some cases, steroids or other medicines may be given to help relieve your symptoms or to treat the underlying cause of your condition. You may have to make some lifestyle changes, such as:  Avoiding alcohol.  Quitting smoking.  Changing your diet.  Exercising.  Changing your sleep habits and your sleep environment. HOME CARE INSTRUCTIONS Take these actions to decrease your discomfort and to help avoid complications. Diet  Follow a diet as recommended by your health care provider. This may involve avoiding foods and drinks such as:  Coffee and tea (with or without caffeine).  Drinks that contain alcohol.  Energy drinks and sports drinks.  Carbonated drinks or sodas.  Chocolate and cocoa.  Peppermint and mint flavorings.  Garlic and onions.  Horseradish.  Spicy and acidic foods,  including peppers, chili powder, curry powder, vinegar, hot sauces, and barbecue sauce.  Citrus fruit juices and citrus fruits, such as oranges, lemons, and limes.  Tomato-based foods, such as red sauce, chili, salsa, and pizza with red sauce.  Fried and fatty foods, such as donuts, french fries, potato chips, and high-fat dressings.  High-fat meats, such as hot dogs and fatty cuts of red and white meats, such as rib eye steak, sausage, ham, and bacon.  High-fat dairy items, such as whole milk, butter, and cream cheese.  Eat small, frequent meals instead of large meals.  Avoid drinking large amounts of liquid with your meals.  Avoid eating meals during the 2-3 hours before bedtime.  Avoid lying down right after you eat.  Do not exercise right after you eat.  Avoid foods and drinks that seem to make your symptoms worse. General Instructions  Pay attention to any changes in your symptoms.  Take over-the-counter and prescription medicines only as told by your health care provider. Do not take aspirin, ibuprofen, or other NSAIDs unless your health care provider told you to do so.  If you have trouble taking pills, use a pill splitter to decrease the size of the pill. This will decrease the chance of the pill getting stuck or injuring your esophagus on the way down. Also, drink water after you take a pill.  Do not use any tobacco products, including cigarettes, chewing tobacco,  and e-cigarettes. If you need help quitting, ask your health care provider.  Wear loose-fitting clothing. Do not wear anything tight around your waist that causes pressure on your abdomen.  Raise (elevate) the head of your bed about 6 inches (15 cm).  Try to reduce your stress, such as with yoga or meditation. If you need help reducing stress, ask your health care provider.  If you are overweight, reduce your weight to an amount that is healthy for you. Ask your health care provider for guidance about a safe  weight loss goal.  Keep all follow-up visits as told by your health care provider. This is important. SEEK MEDICAL CARE IF:  You have new symptoms.  You have unexplained weight loss.  You have difficulty swallowing, or it hurts to swallow.  You have wheezing or a persistent cough.  Your symptoms do not improve with treatment.  You have frequent heartburn for more than two weeks. SEEK IMMEDIATE MEDICAL CARE IF:  You have severe pain in your arms, neck, jaw, teeth, or back.  You feel sweaty, dizzy, or light-headed.  You have chest pain or shortness of breath.  You vomit and your vomit looks like blood or coffee grounds.  Your stool is bloody or black.  You have a fever.  You cannot swallow, drink, or eat.   This information is not intended to replace advice given to you by your health care provider. Make sure you discuss any questions you have with your health care provider.   Document Released: 11/20/2004 Document Revised: 07/04/2015 Document Reviewed: 02/07/2015 Elsevier Interactive Patient Education [employer].

## 2015-09-10 NOTE — ED Provider Notes (Signed)
CSN: [id number]     Arrival date & time 09/10/15  1332 History   First MD Initiated Contact with Patient 09/10/15 1355     Chief Complaint  Patient presents with  . Chest Pain     (Consider location/radiation/quality/duration/timing/severity/associated sxs/prior Treatment) HPI Comments: Tried zantac without relief. She feels like food is getting stuck mid chest.   Patient is a 34 y.o. female presenting with chest pain. The history is provided by the patient. No language interpreter was used.  Chest Pain Pain location:  Substernal area and epigastric Pain quality: aching   Pain radiates to:  Does not radiate Pain radiates to the back: no   Pain severity:  Moderate Onset quality:  Gradual Duration:  3 days Timing:  Constant Progression:  Unchanged Relieved by:  Nothing Associated symptoms: no shortness of breath     Past Medical History  Diagnosis Date  . Blood transfusion   . Acid reflux   . Anxiety    Past Surgical History  Procedure Laterality Date  . Ankle surgery    . Tubal ligation     No family history on file. Social History  Substance Use Topics  . Smoking status: Current Every Day Smoker    Types: Cigars  . Smokeless tobacco: None  . Alcohol Use: Yes     Comment: occasional   OB History    No data available     Review of Systems  Respiratory: Negative for shortness of breath.   Cardiovascular: Positive for chest pain.  All other systems reviewed and are negative.     Allergies  Review of patient's allergies indicates no known allergies.  Home Medications   Prior to Admission medications   Medication Sig Start Date End Date Taking? Authorizing Provider  diphenhydrAMINE (SOMINEX) 25 MG tablet Take 25 mg by mouth at bedtime as needed for sleep.   Yes Historical Provider, MD   BP 153/91 mmHg  Pulse 94  Temp(Src) 98.4 F (36.9 C) (Oral)  Resp 16  Ht 5\' 7"  (1.702 m)  Wt 187 lb (84.823 kg)  BMI 29.28 kg/m2  SpO2 100%  LMP  08/30/2015 Physical Exam  Constitutional: She is oriented to person, place, and time.  HENT:  Head: Normocephalic and atraumatic.  Cardiovascular: Normal rate and regular rhythm.   Pulmonary/Chest: Effort normal and breath sounds normal.  Abdominal: Soft. Bowel sounds are normal. There is no tenderness.  Musculoskeletal: Normal range of motion.  Neurological: She is alert and oriented to person, place, and time.  Skin: Skin is dry.  Psychiatric: She has a normal mood and affect.  Nursing note and vitals reviewed.   ED Course  Procedures (including critical care time) Labs Review Labs Reviewed  BASIC METABOLIC PANEL - Abnormal; Notable for the following:    Glucose, Bld 100 (*)    Calcium 8.4 (*)    All other components within normal limits  CBC WITH DIFFERENTIAL/PLATELET - Abnormal; Notable for the following:    RBC 3.69 (*)    Hemoglobin 8.0 (*)    HCT 28.0 (*)    MCV 75.9 (*)    MCH 21.7 (*)    MCHC 28.6 (*)    RDW 20.4 (*)    Platelets 464 (*)    All other components within normal limits  TROPONIN I    Imaging Review Dg Chest 2 View  09/10/2015  CLINICAL DATA:  Date chest pain, difficulty swallowing food EXAM: CHEST  2 VIEW COMPARISON:  10/29/2012 FINDINGS: The heart size  and mediastinal contours are within normal limits. Both lungs are clear. The visualized skeletal structures are unremarkable. IMPRESSION: No acute abnormalities Electronically Signed   By: Skipper Cliche M.D.   On: 09/10/2015 14:18   I have personally reviewed and evaluated these images and lab results as part of my medical decision-making.   EKG Interpretation   Date/Time:  Monday September 10 2015 13:36:35 EST Ventricular Rate:  95 PR Interval:  144 QRS Duration: 82 QT Interval:  346 QTC Calculation: 434 R Axis:   40 Text Interpretation:  Normal sinus rhythm Normal ECG No previous ECGs  available Confirmed by ZACKOWSKI  MD, SCOTT [phone]) on 09/10/2015 1:47:39  PM      MDM   Final  diagnoses:  Chest pain, unspecified chest pain type  Anemia, unspecified anemia type  Gastroesophageal reflux disease, esophagitis presence not specified    Pt is feeling a little better at this time. Pt is okay to follow up with pcp for continued symptoms. Pt has known history of anemia.    Glendell Docker, NP 09/10/15 [place], MD 09/11/15 [phone]

## 2015-09-15 ENCOUNTER — Emergency Department (HOSPITAL_COMMUNITY): Payer: BC Managed Care – PPO

## 2015-09-15 ENCOUNTER — Emergency Department (HOSPITAL_COMMUNITY)
Admission: EM | Admit: 2015-09-15 | Discharge: 2015-09-15 | Disposition: A | Discharge status: Home / Self Care | Payer: BC Managed Care – PPO | Attending: Emergency Medicine | Admitting: Emergency Medicine

## 2015-09-15 ENCOUNTER — Encounter (HOSPITAL_COMMUNITY): Payer: Self-pay | Admitting: Emergency Medicine

## 2015-09-15 DIAGNOSIS — R0789 Other chest pain: Secondary | ICD-10-CM | POA: Insufficient documentation

## 2015-09-15 DIAGNOSIS — Z8659 Personal history of other mental and behavioral disorders: Secondary | ICD-10-CM | POA: Diagnosis not present

## 2015-09-15 DIAGNOSIS — D649 Anemia, unspecified: Secondary | ICD-10-CM | POA: Insufficient documentation

## 2015-09-15 DIAGNOSIS — R079 Chest pain, unspecified: Secondary | ICD-10-CM | POA: Diagnosis present

## 2015-09-15 DIAGNOSIS — Z79899 Other long term (current) drug therapy: Secondary | ICD-10-CM | POA: Diagnosis not present

## 2015-09-15 DIAGNOSIS — Z3202 Encounter for pregnancy test, result negative: Secondary | ICD-10-CM | POA: Insufficient documentation

## 2015-09-15 DIAGNOSIS — K219 Gastro-esophageal reflux disease without esophagitis: Secondary | ICD-10-CM | POA: Diagnosis not present

## 2015-09-15 DIAGNOSIS — R110 Nausea: Secondary | ICD-10-CM | POA: Insufficient documentation

## 2015-09-15 DIAGNOSIS — F17210 Nicotine dependence, cigarettes, uncomplicated: Secondary | ICD-10-CM | POA: Insufficient documentation

## 2015-09-15 LAB — BASIC METABOLIC PANEL
Anion gap: 8 (ref 5–15)
BUN: 7 mg/dL (ref 6–20)
CALCIUM: 9.8 mg/dL (ref 8.9–10.3)
CO2: 25 mmol/L (ref 22–32)
CREATININE: 0.9 mg/dL (ref 0.44–1.00)
Chloride: 106 mmol/L (ref 101–111)
GFR calc Af Amer: >60 mL/min (ref >60)
GLUCOSE: 101 mg/dL — AB (ref 65–99)
POTASSIUM: 3.7 mmol/L (ref 3.5–5.1)
SODIUM: 139 mmol/L (ref 135–145)

## 2015-09-15 LAB — HEPATIC FUNCTION PANEL
ALT: 39 U/L (ref 14–54)
AST: 36 U/L (ref 15–41)
Albumin: 3.6 g/dL (ref 3.5–5.0)
Alkaline Phosphatase: 59 U/L (ref 38–126)
Total Bilirubin: 0.2 mg/dL — ABNORMAL LOW (ref 0.3–1.2)
Total Protein: 7.4 g/dL (ref 6.5–8.1)

## 2015-09-15 LAB — I-STAT TROPONIN, ED: TROPONIN I, POC: 0 ng/mL (ref 0.00–0.08)

## 2015-09-15 LAB — CBC
HEMATOCRIT: 30.2 % — AB (ref 36.0–46.0)
Hemoglobin: 8.6 g/dL — ABNORMAL LOW (ref 12.0–15.0)
MCH: 21.3 pg — ABNORMAL LOW (ref 26.0–34.0)
MCHC: 28.5 g/dL — AB (ref 30.0–36.0)
MCV: 74.8 fL — ABNORMAL LOW (ref 78.0–100.0)
PLATELETS: 490 10*3/uL — AB (ref 150–400)
RBC: 4.04 MIL/uL (ref 3.87–5.11)
RDW: 19.9 % — ABNORMAL HIGH (ref 11.5–15.5)
WBC: 13.2 10*3/uL — AB (ref 4.0–10.5)

## 2015-09-15 LAB — I-STAT BETA HCG BLOOD, ED (MC, WL, AP ONLY)

## 2015-09-15 MED ORDER — PANTOPRAZOLE SODIUM 40 MG PO TBEC: 40.0000 mg | DELAYED_RELEASE_TABLET | Freq: Every day | ORAL | Status: DC

## 2015-09-15 MED ORDER — GI COCKTAIL ~~LOC~~
30.0000 mL | Freq: Once | ORAL | Status: AC
  Administered 2015-09-15: 30 mL via ORAL
  Filled 2015-09-15: qty 30

## 2015-09-15 MED ORDER — SUCRALFATE 1 GM/10ML PO SUSP: 1.0000 g | Freq: Three times a day (TID) | ORAL | Status: DC | PRN

## 2015-09-15 MED ORDER — PANTOPRAZOLE SODIUM 40 MG IV SOLR
40.0000 mg | Freq: Once | INTRAVENOUS | Status: AC
  Administered 2015-09-15: 40 mg via INTRAVENOUS
  Filled 2015-09-15: qty 40

## 2015-09-15 NOTE — ED Notes (Signed)
Lab to add on HFP 

## 2015-09-15 NOTE — ED Provider Notes (Signed)
CSN: [id number]     Arrival date & time 09/15/15  2014 History   First MD Initiated Contact with Patient 09/15/15 2036     Chief Complaint  Patient presents with  . Chest Pain     (Consider location/radiation/quality/duration/timing/severity/associated sxs/prior Treatment) The history is provided by the patient.  Sherry Blanchard is a 34 y.o. female hx of reflux, anemia here with chest pain. Chest pain and nausea over the last week or so. Patient states that she sometimes has trouble swallowing but is able to keep food down. She felt that food sits in her esophagus and then gradually go down. She went to [place] to [employer] about 5 days ago and was diagnosed as reflux and started on Nexium with no relief. She was unable to see GI yet. She denies any shortness of breath or abdominal pain. Denies any fevers or chills. Denies hx of CAD. No recent travel.     Past Medical History  Diagnosis Date  . Acid reflux   . Anxiety   . Blood transfusion without reported diagnosis    Past Surgical History  Procedure Laterality Date  . Ankle surgery    . Tubal ligation     No family history on file. Social History  Substance Use Topics  . Smoking status: Current Every Day Smoker    Types: Cigars  . Smokeless tobacco: None  . Alcohol Use: Yes     Comment: occasional   OB History    No data available     Review of Systems  Cardiovascular: Positive for chest pain.  All other systems reviewed and are negative.     Allergies  Review of patient's allergies indicates no known allergies.  Home Medications   Prior to Admission medications   Medication Sig Start Date End Date Taking? Authorizing Provider  diphenhydrAMINE (BENADRYL) 25 MG tablet Take 50 mg by mouth at bedtime.   Yes Historical Provider, MD  famotidine (PEPCID) 20 MG tablet Take 1 tablet (20 mg total) by mouth 2 (two) times daily. 09/10/15  Yes Glendell Docker, NP  omeprazole (PRILOSEC OTC) 20 MG tablet Take 20 mg by  mouth daily.   Yes Historical Provider, MD  vitamin C (ASCORBIC ACID) 500 MG tablet Take 500 mg by mouth daily.   Yes Historical Provider, MD   BP 168/89 mmHg  Pulse 93  Temp(Src) 99.8 F (37.7 C) (Oral)  Resp 16  Ht 5\' 7"  (1.702 m)  Wt 203 lb (92.08 kg)  BMI 31.79 kg/m2  SpO2 100%  LMP 08/30/2015 Physical Exam  Constitutional: She is oriented to person, place, and time. She appears well-developed and well-nourished.  HENT:  Head: Normocephalic.  Mouth/Throat: Oropharynx is clear and moist.  Eyes: Conjunctivae are normal. Pupils are equal, round, and reactive to light.  Neck: Normal range of motion. Neck supple.  Cardiovascular: Normal rate, regular rhythm and normal heart sounds.   Pulmonary/Chest: Effort normal and breath sounds normal. No respiratory distress. She has no wheezes. She has no rales.  Abdominal: Soft. Bowel sounds are normal. She exhibits no distension. There is no tenderness. There is no rebound.  Musculoskeletal: Normal range of motion. She exhibits no edema or tenderness.  Neurological: She is alert and oriented to person, place, and time. No cranial nerve deficit. Coordination normal.  Skin: Skin is warm and dry.  Psychiatric: She has a normal mood and affect. Her behavior is normal. Judgment and thought content normal.  Nursing note and vitals reviewed.   ED Course  Procedures (including critical care time) Labs Review Labs Reviewed  BASIC METABOLIC PANEL - Abnormal; Notable for the following:    Glucose, Bld 101 (*)    All other components within normal limits  CBC - Abnormal; Notable for the following:    WBC 13.2 (*)    Hemoglobin 8.6 (*)    HCT 30.2 (*)    MCV 74.8 (*)    MCH 21.3 (*)    MCHC 28.5 (*)    RDW 19.9 (*)    Platelets 490 (*)    All other components within normal limits  HEPATIC FUNCTION PANEL - Abnormal; Notable for the following:    Total Bilirubin 0.2 (*)    Bilirubin, Direct <0.1 (*)    All other components within normal  limits  I-STAT TROPOININ, ED  I-STAT BETA HCG BLOOD, ED ([facility], [facility], [facility] ONLY)    Imaging Review Dg Chest 2 View  09/15/2015  CLINICAL DATA:  Worsening midline chest pain for 10 days. EXAM: CHEST  2 VIEW COMPARISON:  09/10/2015 FINDINGS: The cardiomediastinal contours are normal. The lungs are clear. Pulmonary vasculature is normal. No consolidation, pleural effusion, or pneumothorax. No acute osseous abnormalities are seen. IMPRESSION: No acute pulmonary process. Electronically Signed   By: Jeb Levering M.D.   On: 09/15/2015 21:08   I have personally reviewed and evaluated these images and lab results as part of my medical decision-making.   EKG Interpretation   Date/Time:  Saturday September 15 2015 20:23:15 EST Ventricular Rate:  96 PR Interval:  142 QRS Duration: 78 QT Interval:  340 QTC Calculation: 429 R Axis:   33 Text Interpretation:  Normal sinus rhythm Possible Left atrial enlargement  Nonspecific T wave abnormality Abnormal ECG No significant change since  last tracing Confirmed by YAO  MD, DAVID ([id number]) on 09/15/2015 8:36:26 PM      MDM   Final diagnoses:  None   Sherry Blanchard is a 34 y.o. female here with chest pain, nausea. Likely reflux. Has tried nexium. Will give protonix, GI cocktail. Low risk for ACS. Will get trop x 1. Was anemic to 8 last week but not started on iron and denies any blood in stool or vaginal bleeding. Will recheck CBC.   10:25 PM CBC showed Hg 8.6. CXR clear. Felt better with protonix, GI cocktail. I think likely reflux. Will give protonix, carafate, iron. Will refer to [facility].      Wandra Arthurs, MD 09/15/15 2227

## 2015-09-15 NOTE — ED Notes (Signed)
Pt. reports intermittent mid chest pain with nausea onset last week , denies emesis or diaphoresis , seen at [facility]  5 days ago with the same complaints and was discharged home .

## 2015-09-15 NOTE — Discharge Instructions (Signed)
Take protonix daily.   Continue pepcid as needed.  Take carafate as needed.   Call [facility] office on Monday for follow up.   Return to ER if you have worse chest pain, trouble breathing, food stuck in your throat or chest.

## 2016-05-31 ENCOUNTER — Emergency Department (HOSPITAL_BASED_OUTPATIENT_CLINIC_OR_DEPARTMENT_OTHER)
Admission: EM | Admit: 2016-05-31 | Discharge: 2016-05-31 | Disposition: A | Discharge status: Home / Self Care | Payer: BC Managed Care – PPO | Attending: Dermatology | Admitting: Dermatology

## 2016-05-31 ENCOUNTER — Encounter (HOSPITAL_BASED_OUTPATIENT_CLINIC_OR_DEPARTMENT_OTHER): Payer: Self-pay | Admitting: *Deleted

## 2016-05-31 DIAGNOSIS — R51 Headache: Secondary | ICD-10-CM | POA: Insufficient documentation

## 2016-05-31 DIAGNOSIS — Z5321 Procedure and treatment not carried out due to patient leaving prior to being seen by health care provider: Secondary | ICD-10-CM | POA: Diagnosis not present

## 2016-05-31 DIAGNOSIS — F17210 Nicotine dependence, cigarettes, uncomplicated: Secondary | ICD-10-CM | POA: Diagnosis not present

## 2016-05-31 NOTE — ED Notes (Signed)
Called in lobby area without response

## 2016-05-31 NOTE — ED Notes (Signed)
Called pt in lobby without response

## 2016-05-31 NOTE — ED Triage Notes (Signed)
Pt reports sharp pain "about every 5 minutes" in the back of her head for about 3 days. Pain now radiates through her neck. Has been taking ibuprofen without relief.

## 2016-09-04 ENCOUNTER — Encounter (HOSPITAL_BASED_OUTPATIENT_CLINIC_OR_DEPARTMENT_OTHER): Payer: Self-pay

## 2016-09-04 ENCOUNTER — Emergency Department (HOSPITAL_BASED_OUTPATIENT_CLINIC_OR_DEPARTMENT_OTHER)
Admission: EM | Admit: 2016-09-04 | Discharge: 2016-09-04 | Disposition: A | Discharge status: Home / Self Care | Payer: BC Managed Care – PPO | Attending: Emergency Medicine | Admitting: Emergency Medicine

## 2016-09-04 DIAGNOSIS — F17290 Nicotine dependence, other tobacco product, uncomplicated: Secondary | ICD-10-CM | POA: Diagnosis not present

## 2016-09-04 DIAGNOSIS — K149 Disease of tongue, unspecified: Secondary | ICD-10-CM | POA: Diagnosis present

## 2016-09-04 DIAGNOSIS — K140 Glossitis: Secondary | ICD-10-CM | POA: Diagnosis not present

## 2016-09-04 DIAGNOSIS — I10 Essential (primary) hypertension: Secondary | ICD-10-CM | POA: Insufficient documentation

## 2016-09-04 HISTORY — DX: Essential (primary) hypertension: I10

## 2016-09-04 MED ORDER — AMOXICILLIN-POT CLAVULANATE 500-125 MG PO TABS: 1.0000 | ORAL_TABLET | Freq: Three times a day (TID) | ORAL | 0 refills | Status: AC

## 2016-09-04 NOTE — ED Triage Notes (Signed)
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

## 2016-09-04 NOTE — Discharge Instructions (Signed)
You were seen today for some drainage in your tongue. Your tongue may be infected. We have given you an antibiotic prescription. Please take this medication as prescribed. You will need to call your ENT doctor tomorrow to be seen ASAP for this. Please return here if you have worse tongue pain, swelling, or develop a fever. Take care!

## 2016-09-04 NOTE — ED Provider Notes (Signed)
[facility] Provider Note   CSN: [id number] Arrival date & time: 09/04/16  1951   History   Chief Complaint Chief Complaint  Patient presents with  . Sore    sore on tongue    HPI   Sherry Blanchard is a 35 y.o. female with PMH of acid reflux, anxiety, HTN, s/p tongue granuloma removal on 10/ who presents with drainage from her tongue. Patient states that she had a granuloma removed from her tongue on October 20 by Dr. Ilda Foil. She went to see Dr. Ilda Foil on October 31 and he removed the sutures from her surgery and said that she is doing well. Today patient noted that she had a bad taste in her mouth and when she looked in the mirror she noticed that there is some yellowish blood tinged fluid coming from her tongue. She denies any increased pain in her tongue since her surgery. Denies any trouble swallowing or speaking. Denies any fevers, chills, chest pain, shortness of breath and cough.   Past Medical History:  Diagnosis Date  . Acid reflux   . Anxiety   . Blood transfusion without reported diagnosis   . Hypertension     There are no active problems to display for this patient.   Past Surgical History:  Procedure Laterality Date  . ANKLE SURGERY    . TUBAL LIGATION      OB History    No data available       Home Medications    Prior to Admission medications   Medication Sig Start Date End Date Taking? Authorizing Provider  amoxicillin-clavulanate (AUGMENTIN) 500-125 MG tablet Take 1 tablet (500 mg total) by mouth 3 (three) times daily. 09/04/16 09/11/16  Carlyle Dolly, MD  diphenhydrAMINE (BENADRYL) 25 MG tablet Take 50 mg by mouth at bedtime.    Historical Provider, MD  famotidine (PEPCID) 20 MG tablet Take 1 tablet (20 mg total) by mouth 2 (two) times daily. 09/10/15   Glendell Docker, NP  omeprazole (PRILOSEC OTC) 20 MG tablet Take 20 mg by mouth daily.    Historical Provider, MD  pantoprazole (PROTONIX) 40 MG tablet Take 1 tablet (40 mg  total) by mouth daily. 09/15/15   Drenda Freeze, MD  sucralfate (CARAFATE) 1 GM/10ML suspension Take 10 mLs (1 g total) by mouth 3 (three) times daily with meals as needed. 09/15/15   Drenda Freeze, MD  vitamin C (ASCORBIC ACID) 500 MG tablet Take 500 mg by mouth daily.    Historical Provider, MD    Family History No family history on file.  Social History Social History  Substance Use Topics  . Smoking status: Current Every Day Smoker    Types: Cigars  . Smokeless tobacco: Never Used  . Alcohol use Yes     Comment: occasional     Allergies   Patient has no known allergies.   Review of Systems Review of Systems  10 Systems reviewed and are negative for acute change except as noted in the HPI.   Physical Exam Updated Vital Signs BP 144/87 (BP Location: Left Arm)   Pulse 86   Temp 98 F (36.7 C) (Oral)   Resp 18   Ht 5\' 7"  (1.702 m)   Wt 83.9 kg   LMP 09/01/2016   SpO2 100%   BMI 28.98 kg/m   Physical Exam  Constitutional: She is oriented to person, place, and time. She appears well-developed and well-nourished.  HENT:  Head: Normocephalic and atraumatic.  Right Ear:  External ear normal.  Left Ear: External ear normal.  Nose: Nose normal.  Mouth/Throat:    Left lateral side of tongue s/p partial excision, well healed.   Eyes: Conjunctivae and EOM are normal. Pupils are equal, round, and reactive to light.  Neck: Normal range of motion. Neck supple.  Cardiovascular: Normal rate, regular rhythm and intact distal pulses.   Pulmonary/Chest: Effort normal and breath sounds normal.  Abdominal: Soft. Bowel sounds are normal. She exhibits no distension. There is no tenderness.  Musculoskeletal: Normal range of motion. She exhibits no edema or tenderness.  Lymphadenopathy:    She has no cervical adenopathy.  Neurological: She is alert and oriented to person, place, and time. No sensory deficit.  Skin: Skin is warm. Capillary refill takes less than 2 seconds.   Psychiatric: She has a normal mood and affect. Her behavior is normal. Judgment and thought content normal.     ED Treatments / Results  Labs (all labs ordered are listed, but only abnormal results are displayed) Labs Reviewed - No data to display  EKG  EKG Interpretation None       Radiology No results found.  Procedures Procedures (including critical care time)  Medications Ordered in ED Medications - No data to display   Initial Impression / Assessment and Plan / ED Course  I have reviewed the triage vital signs and the nursing notes.  Pertinent labs & imaging results that were available during my care of the patient were reviewed by me and considered in my medical decision making (see chart for details).  Clinical Course    Patient is a 35 y.o. female with PMH of acid reflux, anxiety, HTN, and s/p tongue granuloma removal on 10/20 who presented with drainage from her tongue today. Vital signs within normal limits except for some mild hypertension to 144/87, patient afebrile. Physical exam showing left lateral side of tongue s/p partial excision, well healed. There was a 54mm area of purulent/bloody drainage near excision site. Tongue did not appear enlarged or discolored. Likely has infection on tongue, no systemic signs of infection. Patient sent home with prescription for Augmentin 500 TID x 1 week and instructed to follow up with ENT tomorrow.   Final Clinical Impressions(s) / ED Diagnoses   Final diagnoses:  Tongue infection    New Prescriptions Discharge Medication List as of 09/04/2016  8:44 PM    START taking these medications   Details  amoxicillin-clavulanate (AUGMENTIN) 500-125 MG tablet Take 1 tablet (500 mg total) by mouth 3 (three) times daily., Starting Thu 09/04/2016, Until Thu 09/11/2016, Print         Carlyle Dolly, MD 09/04/16 [id number]    Veryl Speak, MD 09/06/16 [phone]

## 2017-01-21 ENCOUNTER — Emergency Department (HOSPITAL_BASED_OUTPATIENT_CLINIC_OR_DEPARTMENT_OTHER)
Admission: EM | Admit: 2017-01-21 | Discharge: 2017-01-21 | Disposition: A | Discharge status: Home / Self Care | Payer: BC Managed Care – PPO | Attending: Emergency Medicine | Admitting: Emergency Medicine

## 2017-01-21 ENCOUNTER — Encounter (HOSPITAL_BASED_OUTPATIENT_CLINIC_OR_DEPARTMENT_OTHER): Payer: Self-pay | Admitting: Emergency Medicine

## 2017-01-21 DIAGNOSIS — S39012A Strain of muscle, fascia and tendon of lower back, initial encounter: Secondary | ICD-10-CM | POA: Diagnosis not present

## 2017-01-21 DIAGNOSIS — F17290 Nicotine dependence, other tobacco product, uncomplicated: Secondary | ICD-10-CM | POA: Diagnosis not present

## 2017-01-21 DIAGNOSIS — Z79899 Other long term (current) drug therapy: Secondary | ICD-10-CM | POA: Insufficient documentation

## 2017-01-21 DIAGNOSIS — I10 Essential (primary) hypertension: Secondary | ICD-10-CM | POA: Insufficient documentation

## 2017-01-21 DIAGNOSIS — Y999 Unspecified external cause status: Secondary | ICD-10-CM | POA: Insufficient documentation

## 2017-01-21 DIAGNOSIS — Y9389 Activity, other specified: Secondary | ICD-10-CM | POA: Diagnosis not present

## 2017-01-21 DIAGNOSIS — Y92410 Unspecified street and highway as the place of occurrence of the external cause: Secondary | ICD-10-CM | POA: Diagnosis not present

## 2017-01-21 DIAGNOSIS — S3992XA Unspecified injury of lower back, initial encounter: Secondary | ICD-10-CM | POA: Diagnosis present

## 2017-01-21 MED ORDER — IBUPROFEN 400 MG PO TABS: 600.0000 mg | ORAL_TABLET | Freq: Once | ORAL | Status: DC

## 2017-01-21 MED ORDER — IBUPROFEN 600 MG PO TABS: 600.0000 mg | ORAL_TABLET | Freq: Four times a day (QID) | ORAL | 0 refills | Status: DC | PRN

## 2017-01-21 MED FILL — IBUPROFEN 600 MG TABLET: 600 | 7 days supply | Qty: 30 | Fill #0
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
## 2017-01-21 NOTE — Discharge Instructions (Signed)
Try heating pads, Epsom salts baths, stretches. Move around to reduce soreness. Take ibuprofen for pain. Follow-up as needed.

## 2018-03-11 ENCOUNTER — Other Ambulatory Visit: Payer: Self-pay

## 2018-03-11 ENCOUNTER — Emergency Department (HOSPITAL_BASED_OUTPATIENT_CLINIC_OR_DEPARTMENT_OTHER): Payer: Managed Care, Other (non HMO)

## 2018-03-11 ENCOUNTER — Emergency Department (HOSPITAL_BASED_OUTPATIENT_CLINIC_OR_DEPARTMENT_OTHER)
Admission: EM | Admit: 2018-03-11 | Discharge: 2018-03-11 | Disposition: A | Discharge status: Home / Self Care | Payer: Managed Care, Other (non HMO) | Attending: Emergency Medicine | Admitting: Emergency Medicine

## 2018-03-11 ENCOUNTER — Encounter (HOSPITAL_BASED_OUTPATIENT_CLINIC_OR_DEPARTMENT_OTHER): Payer: Self-pay | Admitting: *Deleted

## 2018-03-11 DIAGNOSIS — F419 Anxiety disorder, unspecified: Secondary | ICD-10-CM | POA: Diagnosis not present

## 2018-03-11 DIAGNOSIS — S199XXA Unspecified injury of neck, initial encounter: Secondary | ICD-10-CM | POA: Diagnosis present

## 2018-03-11 DIAGNOSIS — Y9389 Activity, other specified: Secondary | ICD-10-CM | POA: Diagnosis not present

## 2018-03-11 DIAGNOSIS — Y929 Unspecified place or not applicable: Secondary | ICD-10-CM | POA: Insufficient documentation

## 2018-03-11 DIAGNOSIS — S161XXA Strain of muscle, fascia and tendon at neck level, initial encounter: Secondary | ICD-10-CM | POA: Insufficient documentation

## 2018-03-11 DIAGNOSIS — Y998 Other external cause status: Secondary | ICD-10-CM | POA: Insufficient documentation

## 2018-03-11 DIAGNOSIS — R252 Cramp and spasm: Secondary | ICD-10-CM | POA: Insufficient documentation

## 2018-03-11 DIAGNOSIS — I10 Essential (primary) hypertension: Secondary | ICD-10-CM | POA: Insufficient documentation

## 2018-03-11 DIAGNOSIS — F17290 Nicotine dependence, other tobacco product, uncomplicated: Secondary | ICD-10-CM | POA: Insufficient documentation

## 2018-03-11 DIAGNOSIS — M62838 Other muscle spasm: Secondary | ICD-10-CM

## 2018-03-11 DIAGNOSIS — X58XXXA Exposure to other specified factors, initial encounter: Secondary | ICD-10-CM | POA: Insufficient documentation

## 2018-03-11 MED ORDER — HYDROCODONE-ACETAMINOPHEN 5-325 MG PO TABS: 1.0000 | ORAL_TABLET | ORAL | 0 refills | Status: DC | PRN

## 2018-03-11 MED ORDER — CYCLOBENZAPRINE HCL 10 MG PO TABS: 10.0000 mg | ORAL_TABLET | Freq: Two times a day (BID) | ORAL | 0 refills | Status: DC | PRN

## 2018-03-11 MED FILL — CYCLOBENZAPRINE HCL 10 MG T: 10 | 10 days supply | Qty: 20 | Fill #0

## 2018-03-11 MED FILL — HYDROCODON-APAP 5-325: 5-325 | 1 days supply | Qty: 8 | Fill #0

## 2018-03-11 NOTE — Discharge Instructions (Signed)
Your exam today was consistent with muscle spasms of the neck causing her pain.  Please use the medications to help with your symptoms and follow-up with a primary care physician for further management.  If any symptoms change or worsen, please return to the nearest emergency department.

## 2018-03-11 NOTE — ED Triage Notes (Signed)
Pt says that she started having pain in the left neck and shoulder yesterday. This morning when she woke up she still had the pain and some numbness in the upper left arm. Pain worse to sitting up and movement.
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
## 2020-01-27 ENCOUNTER — Encounter (HOSPITAL_BASED_OUTPATIENT_CLINIC_OR_DEPARTMENT_OTHER): Payer: Self-pay | Admitting: *Deleted

## 2020-01-27 ENCOUNTER — Other Ambulatory Visit: Payer: Self-pay

## 2020-01-27 ENCOUNTER — Emergency Department (HOSPITAL_BASED_OUTPATIENT_CLINIC_OR_DEPARTMENT_OTHER)
Admission: EM | Admit: 2020-01-27 | Discharge: 2020-01-27 | Disposition: A | Discharge status: Home / Self Care | Payer: No Typology Code available for payment source | Attending: Emergency Medicine | Admitting: Emergency Medicine

## 2020-01-27 DIAGNOSIS — Z79899 Other long term (current) drug therapy: Secondary | ICD-10-CM | POA: Insufficient documentation

## 2020-01-27 DIAGNOSIS — R1084 Generalized abdominal pain: Secondary | ICD-10-CM | POA: Insufficient documentation

## 2020-01-27 DIAGNOSIS — I10 Essential (primary) hypertension: Secondary | ICD-10-CM | POA: Insufficient documentation

## 2020-01-27 DIAGNOSIS — Z87891 Personal history of nicotine dependence: Secondary | ICD-10-CM | POA: Insufficient documentation

## 2020-01-27 DIAGNOSIS — R109 Unspecified abdominal pain: Secondary | ICD-10-CM | POA: Diagnosis present

## 2020-01-27 LAB — CBC WITH DIFFERENTIAL/PLATELET
Abs Immature Granulocytes: 0.02 10*3/uL (ref 0.00–0.07)
Basophils Absolute: 0 10*3/uL (ref 0.0–0.1)
Basophils Relative: 0 %
Eosinophils Absolute: 0.1 10*3/uL (ref 0.0–0.5)
Eosinophils Relative: 1 %
HCT: 40.6 % (ref 36.0–46.0)
Hemoglobin: 13.5 g/dL (ref 12.0–15.0)
Immature Granulocytes: 0 %
Lymphocytes Relative: 34 %
Lymphs Abs: 2.5 10*3/uL (ref 0.7–4.0)
MCH: 31.8 pg (ref 26.0–34.0)
MCHC: 33.3 g/dL (ref 30.0–36.0)
MCV: 95.8 fL (ref 80.0–100.0)
Monocytes Absolute: 0.4 10*3/uL (ref 0.1–1.0)
Monocytes Relative: 5 %
Neutro Abs: 4.4 10*3/uL (ref 1.7–7.7)
Neutrophils Relative %: 60 %
Platelets: 342 10*3/uL (ref 150–400)
RBC: 4.24 MIL/uL (ref 3.87–5.11)
RDW: 12.9 % (ref 11.5–15.5)
WBC: 7.4 10*3/uL (ref 4.0–10.5)
nRBC: 0 % (ref 0.0–0.2)

## 2020-01-27 LAB — COMPREHENSIVE METABOLIC PANEL
ALT: 37 U/L (ref 0–44)
AST: 35 U/L (ref 15–41)
Albumin: 4.1 g/dL (ref 3.5–5.0)
Alkaline Phosphatase: 54 U/L (ref 38–126)
Anion gap: 10 (ref 5–15)
BUN: 11 mg/dL (ref 6–20)
CO2: 25 mmol/L (ref 22–32)
Calcium: 9.2 mg/dL (ref 8.9–10.3)
Chloride: 104 mmol/L (ref 98–111)
Creatinine, Ser: 0.9 mg/dL (ref 0.44–1.00)
GFR calc Af Amer: >60 mL/min (ref >60)
GFR calc non Af Amer: >60 mL/min (ref >60)
Glucose, Bld: 112 mg/dL — ABNORMAL HIGH (ref 70–99)
Potassium: 3.8 mmol/L (ref 3.5–5.1)
Sodium: 139 mmol/L (ref 135–145)
Total Bilirubin: 0.5 mg/dL (ref 0.3–1.2)
Total Protein: 7.4 g/dL (ref 6.5–8.1)

## 2020-01-27 LAB — URINALYSIS, ROUTINE W REFLEX MICROSCOPIC
Bilirubin Urine: NEGATIVE
Glucose, UA: NEGATIVE mg/dL
Ketones, ur: NEGATIVE mg/dL
Leukocytes,Ua: NEGATIVE
Nitrite: NEGATIVE
Protein, ur: NEGATIVE mg/dL
Specific Gravity, Urine: 1.02 (ref 1.005–1.030)
pH: 6.5 (ref 5.0–8.0)

## 2020-01-27 LAB — URINALYSIS, MICROSCOPIC (REFLEX): WBC, UA: NONE SEEN WBC/hpf (ref 0–5)

## 2020-01-27 LAB — LIPASE, BLOOD: Lipase: 23 U/L (ref 11–51)

## 2020-01-27 LAB — PREGNANCY, URINE: Preg Test, Ur: NEGATIVE

## 2020-01-27 MED ORDER — DICYCLOMINE HCL 20 MG PO TABS: 20.0000 mg | ORAL_TABLET | Freq: Two times a day (BID) | ORAL | 0 refills | Status: DC

## 2020-01-27 NOTE — ED Provider Notes (Signed)
[facility] EMERGENCY DEPARTMENT Provider Note   CSN: [id number] Arrival date & time: 01/27/20  1219     History Chief Complaint  Patient presents with  . Abdominal Pain    Sherry Blanchard is a 39 y.o. female with PMHx HTN, anxiety, and acid reflux who presents to the ED today complaining of gradual onset, constant, waxing and waning, dull/aching, infraumbilical pain x 1 month.  Patient reports she will wake up in the morning and feel like her abdomen is sore.  States that the pain will resolve throughout the day as she is getting up and moving around however is worse in the morning and at night when she is laying down.  Denies any nausea, vomiting, diarrhea, constipation with this.  She has a regular bowel movement every day.  No blood or melena.  Does endorse history of endoscopy due to dysphagia with manometry.  She takes PPI daily.  She reports she attempted to see her PCP however they are busy so she came to the ED to be evaluated.  Denies fevers, chills, urinary symptoms, vaginal discharge, pelvic pain, any other associated symptoms.  Surgical history includes tubal ligation as well as cesarean section. LNMP 1 week ago.   The history is provided by the patient and medical records.       Past Medical History:  Diagnosis Date  . Acid reflux   . Anxiety   . Blood transfusion without reported diagnosis   . Hypertension     There are no problems to display for this patient.   Past Surgical History:  Procedure Laterality Date  . ANKLE SURGERY    . TUBAL LIGATION       OB History   No obstetric history on file.     No family history on file.  Social History   Tobacco Use  . Smoking status: Former Research scientist (life sciences)  . Smokeless tobacco: Never Used  Substance Use Topics  . Alcohol use: Yes    Comment: occasional  . Drug use: No    Home Medications Prior to Admission medications   Medication Sig Start Date End Date Taking? Authorizing Provider  amLODipine  (NORVASC) 5 MG tablet Take 5 mg by mouth daily.   Yes [provider]  diphenhydrAMINE (BENADRYL) 25 MG tablet Take 50 mg by mouth at bedtime.   Yes [provider]  ibuprofen (ADVIL,MOTRIN) 600 MG tablet Take 1 tablet (600 mg total) by mouth every 6 (six) hours as needed. 01/21/17  Yes Kirichenko, Tatyana, PA-C  losartan (COZAAR) 50 MG tablet Take by mouth. 12/20/19  Yes [provider]  pantoprazole (PROTONIX) 40 MG tablet Take by mouth. 11/03/19  Yes [provider]  SIMVASTATIN PO Take by mouth.   Yes [provider]  calcium carbonate (TUMS - DOSED IN MG ELEMENTAL CALCIUM) 500 MG chewable tablet Chew 1 tablet by mouth daily.    [provider]  cyclobenzaprine (FLEXERIL) 10 MG tablet Take 1 tablet (10 mg total) by mouth 2 (two) times daily as needed for muscle spasms. 03/11/18   Tegeler, Gwenyth Allegra, MD  dicyclomine (BENTYL) 20 MG tablet Take 1 tablet (20 mg total) by mouth 2 (two) times daily. 01/27/20   Alroy Bailiff, Uel Davidow, PA-C  hydrochlorothiazide (HYDRODIURIL) 25 MG tablet Take 25 mg by mouth daily.    [provider]  HYDROcodone-acetaminophen (NORCO/VICODIN) 5-325 MG tablet Take 1 tablet by mouth every 4 (four) hours as needed. 03/11/18   Tegeler, Gwenyth Allegra, MD  metFORMIN (GLUCOPHAGE) 500 MG  tablet Take by mouth 2 (two) times daily with a meal.    [provider]  vitamin C (ASCORBIC ACID) 500 MG tablet Take 500 mg by mouth daily.    [provider]    Allergies    Patient has no known allergies.  Review of Systems   Review of Systems  Constitutional: Negative for chills and fever.  Respiratory: Negative for shortness of breath.   Cardiovascular: Negative for chest pain.  Gastrointestinal: Positive for abdominal pain. Negative for constipation, diarrhea, nausea and vomiting.  Genitourinary: Negative for difficulty urinating, dysuria, flank pain and menstrual problem.  All other systems reviewed and are  negative.   Physical Exam Updated Vital Signs BP (!) 149/106 (BP Location: Left Arm)   Pulse 100   Temp 98.1 F (36.7 C) (Oral)   Resp 16   Ht 5\' 8"  (1.727 m)   Wt 99.3 kg   LMP 01/20/2020   SpO2 99%   BMI 33.28 kg/m   Physical Exam Vitals and nursing note reviewed.  Constitutional:      Appearance: She is not ill-appearing or diaphoretic.  HENT:     Head: Normocephalic and atraumatic.  Eyes:     Conjunctiva/sclera: Conjunctivae normal.  Cardiovascular:     Rate and Rhythm: Normal rate and regular rhythm.     Heart sounds: Normal heart sounds.  Pulmonary:     Effort: Pulmonary effort is normal.     Breath sounds: Normal breath sounds. No wheezing, rhonchi or rales.  Abdominal:     General: Bowel sounds are normal.     Palpations: Abdomen is soft.     Tenderness: There is generalized abdominal tenderness. There is no right CVA tenderness, left CVA tenderness, guarding or rebound.  Musculoskeletal:     Cervical back: Neck supple.  Skin:    General: Skin is warm and dry.  Neurological:     Mental Status: She is alert.     ED Results / Procedures / Treatments   Labs (all labs ordered are listed, but only abnormal results are displayed) Labs Reviewed  URINALYSIS, ROUTINE W REFLEX MICROSCOPIC - Abnormal; Notable for the following components:      Result Value   Hgb urine dipstick MODERATE (*)    All other components within normal limits  URINALYSIS, MICROSCOPIC (REFLEX) - Abnormal; Notable for the following components:   Bacteria, UA FEW (*)    All other components within normal limits  COMPREHENSIVE METABOLIC PANEL - Abnormal; Notable for the following components:   Glucose, Bld 112 (*)    All other components within normal limits  PREGNANCY, URINE  LIPASE, BLOOD  CBC WITH DIFFERENTIAL/PLATELET    EKG None  Radiology No results found.  Procedures Procedures (including critical care time)  Medications Ordered in ED Medications - No data to  display  ED Course  I have reviewed the triage vital signs and the nursing notes.  Pertinent labs & imaging results that were available during my care of the patient were reviewed by me and considered in my medical decision making (see chart for details).    MDM Rules/Calculators/A&P                      39 year old female presents to the ED complaining of periumbilical abdominal pain x1 month.  Will to follow-up with PCP so came to the ED for further evaluation.  Reports she had a burning type sensation in her lower abdomen earlier today which was new for  her.  No nausea, vomiting, diarrhea.  On arrival to the ED patient is afebrile, tachycardic at 100, nontachypneic.  She appears to be in no acute distress.  During my exam her heart rate has decreased into the 90s.  She has some mild diffuse abdominal tenderness however no peritoneal signs.  Given this pain has been ongoing for 1 month would suspect her consequences if it was an acute intrabdominal process including surgical abdomen. Will obtain screening labs at this time and reevaluate. Suspect pt will need to follow up with her PCP regarding chronic abdominal pain.   CBC without leukocytosis. Hgb stable.  CMP with glucose 112. No other abnormalities at this time. Creatinine 0.90. No elevation in LFTs.  Lipase 23.  U/A with moderate hgb and few bacteria however 6-10 squamous epithelium. It does appear pt's had hgb in urine in the past. No gross blood.   Labwork reassuring at this time. Difficult to discern why patient is having this persistent abdominal pain x 1 month. Again she appears to be in NAD today and is nontoxic appearing. I doubt acute abdomen at this time and do not feel that pt requires CT scan at this time. Have advised she follow up with PCP for further evaluation. Will prescribe bentyl to see if this helps patient as well. Strict return precautions discussed including worsening pain, excessive vomiting, blood in stool, diarrhea,  constipation, gross blood in urine, pelvic pain, vaginal discharge. Pt is in agreement with plan and stable for discharge home.   This note was prepared using Dragon voice recognition software and may include unintentional dictation errors due to the inherent limitations of voice recognition software.  Final Clinical Impression(s) / ED Diagnoses Final diagnoses:  Generalized abdominal pain    Rx / DC Orders ED Discharge Orders         Ordered    dicyclomine (BENTYL) 20 MG tablet  2 times daily     01/27/20 1431           Discharge Instructions     Your labwork was very reassuring today. Please pick up medication and take as prescribed.  Follow up with your PCP regarding your ED visit today. They may decide to do further tests or images.  Return to the ED IMMEDIATELY for any worsening symptoms including worsening pain, excessive vomiting, vomiting blood, bright red blood in stool or dark black stool, diarrhea/constipation, urinary symptoms, vaginal discharge or pelvic pain.        Eustaquio Maize, PA-C 01/27/20 [place], [place], DO 01/27/20 1517

## 2020-01-27 NOTE — ED Triage Notes (Signed)
Abdominal pain for a month. Pain is lower mid abdomen. Pain makes her feel bloated.

## 2020-01-27 NOTE — Discharge Instructions (Addendum)
Your labwork was very reassuring today. Please pick up medication and take as prescribed.  Follow up with your PCP regarding your ED visit today. They may decide to do further tests or images.  Return to the ED IMMEDIATELY for any worsening symptoms including worsening pain, excessive vomiting, vomiting blood, bright red blood in stool or dark black stool, diarrhea/constipation, urinary symptoms, vaginal discharge or pelvic pain.

## 2020-11-02 ENCOUNTER — Other Ambulatory Visit: Payer: Self-pay

## 2020-11-02 ENCOUNTER — Encounter (HOSPITAL_BASED_OUTPATIENT_CLINIC_OR_DEPARTMENT_OTHER): Payer: Self-pay | Admitting: Emergency Medicine

## 2020-11-02 ENCOUNTER — Emergency Department (HOSPITAL_BASED_OUTPATIENT_CLINIC_OR_DEPARTMENT_OTHER)
Admission: EM | Admit: 2020-11-02 | Discharge: 2020-11-02 | Disposition: A | Discharge status: Home / Self Care | Payer: Medicaid Other | Attending: Emergency Medicine | Admitting: Emergency Medicine

## 2020-11-02 ENCOUNTER — Emergency Department (HOSPITAL_BASED_OUTPATIENT_CLINIC_OR_DEPARTMENT_OTHER): Payer: Medicaid Other

## 2020-11-02 DIAGNOSIS — M546 Pain in thoracic spine: Secondary | ICD-10-CM | POA: Diagnosis present

## 2020-11-02 DIAGNOSIS — Z79899 Other long term (current) drug therapy: Secondary | ICD-10-CM | POA: Insufficient documentation

## 2020-11-02 DIAGNOSIS — Z87891 Personal history of nicotine dependence: Secondary | ICD-10-CM | POA: Diagnosis not present

## 2020-11-02 DIAGNOSIS — E119 Type 2 diabetes mellitus without complications: Secondary | ICD-10-CM | POA: Insufficient documentation

## 2020-11-02 DIAGNOSIS — I10 Essential (primary) hypertension: Secondary | ICD-10-CM | POA: Insufficient documentation

## 2020-11-02 DIAGNOSIS — Z7984 Long term (current) use of oral hypoglycemic drugs: Secondary | ICD-10-CM | POA: Insufficient documentation

## 2020-11-02 HISTORY — DX: Type 2 diabetes mellitus without complications: E11.9

## 2020-11-02 MED ORDER — METHOCARBAMOL 500 MG PO TABS: 1000.0000 mg | ORAL_TABLET | Freq: Four times a day (QID) | ORAL | 0 refills | Status: AC

## 2020-11-02 MED ORDER — NAPROXEN 500 MG PO TABS: 500.0000 mg | ORAL_TABLET | Freq: Two times a day (BID) | ORAL | 0 refills | Status: AC

## 2020-11-02 NOTE — ED Triage Notes (Addendum)
Reports mid upper back pain that radiates around to ribcage on both side.  Worse on the left.  Started last night.  Denies any other symptoms.  No relief with use of ibuprofen.  Does report getting flu vaccine yesterday.

## 2020-11-02 NOTE — ED Provider Notes (Signed)
[facility] EMERGENCY DEPARTMENT Provider Note   CSN: [id number] Arrival date & time: 11/02/20  1047     History Chief Complaint  Patient presents with  . Back Pain  . ribcage pain    Sherry Blanchard is a 40 y.o. female.  Patient with no previous abdominal surgery history presents the emergency department with pain in her mid upper back that radiates to the bilateral flanks.  Pain began yesterday evening and gradually worsened causing her difficulty sleeping last night.  Patient has taken ibuprofen and applied topical medication without much improvement.  She does have a history of hypertension, diabetes, and high cholesterol.  She denies anterior chest pain, vomiting, diaphoresis.  Pain is nonexertional.  She denies any anterior abdominal pain including in the right and left upper quadrants or epigastrium.  No associated diarrhea or urinary symptoms.  Pain is worse with deep breathing.  She received a flu shot yesterday and was wondering if this could relate.  Pain is worse with movement and certain positions.  It is not made worse with palpation. Patient denies risk factors for pulmonary embolism including: unilateral leg swelling, history of DVT/PE/other blood clots, use of exogenous hormones, recent immobilizations, recent surgery, recent travel (>4hr segment), malignancy, hemoptysis.          Past Medical History:  Diagnosis Date  . Acid reflux   . Anxiety   . Blood transfusion without reported diagnosis   . Diabetes mellitus without complication ([place])   . Hypertension     There are no problems to display for this patient.   Past Surgical History:  Procedure Laterality Date  . ANKLE SURGERY    . TUBAL LIGATION       OB History   No obstetric history on file.     No family history on file.  Social History   Tobacco Use  . Smoking status: Former Research scientist (life sciences)  . Smokeless tobacco: Never Used  Vaping Use  . Vaping Use: Every day  Substance Use Topics  .  Alcohol use: Yes    Comment: occasional  . Drug use: No    Home Medications Prior to Admission medications   Medication Sig Start Date End Date Taking? Authorizing Provider  amLODipine (NORVASC) 5 MG tablet Take 5 mg by mouth daily.    [provider]  calcium carbonate (TUMS - DOSED IN MG ELEMENTAL CALCIUM) 500 MG chewable tablet Chew 1 tablet by mouth daily.    [provider]  cyclobenzaprine (FLEXERIL) 10 MG tablet Take 1 tablet (10 mg total) by mouth 2 (two) times daily as needed for muscle spasms. 03/11/18   Tegeler, Gwenyth Allegra, MD  dicyclomine (BENTYL) 20 MG tablet Take 1 tablet (20 mg total) by mouth 2 (two) times daily. 01/27/20   Alroy Bailiff, Margaux, PA-C  diphenhydrAMINE (BENADRYL) 25 MG tablet Take 50 mg by mouth at bedtime.    [provider]  hydrochlorothiazide (HYDRODIURIL) 25 MG tablet Take 25 mg by mouth daily.    [provider]  HYDROcodone-acetaminophen (NORCO/VICODIN) 5-325 MG tablet Take 1 tablet by mouth every 4 (four) hours as needed. 03/11/18   Tegeler, Gwenyth Allegra, MD  ibuprofen (ADVIL,MOTRIN) 600 MG tablet Take 1 tablet (600 mg total) by mouth every 6 (six) hours as needed. 01/21/17   Kirichenko, Lahoma Rocker, PA-C  losartan (COZAAR) 50 MG tablet Take by mouth. 12/20/19   [provider]  metFORMIN (GLUCOPHAGE) 500 MG tablet Take by mouth 2 (two) times daily with a meal.  [provider]  pantoprazole (PROTONIX) 40 MG tablet Take by mouth. 11/03/19   [provider]  SIMVASTATIN PO Take by mouth.    [provider]  vitamin C (ASCORBIC ACID) 500 MG tablet Take 500 mg by mouth daily.    [provider]    Allergies    Patient has no known allergies.  Review of Systems   Review of Systems  Constitutional: Negative for fever.  HENT: Negative for rhinorrhea and sore throat.   Eyes: Negative for redness.  Respiratory: Negative for cough and shortness of breath.   Cardiovascular: Negative for  chest pain.  Gastrointestinal: Negative for abdominal pain, diarrhea, nausea and vomiting.  Genitourinary: Positive for flank pain. Negative for dysuria, frequency, hematuria and urgency.  Musculoskeletal: Positive for back pain. Negative for myalgias.  Skin: Negative for rash.  Neurological: Negative for headaches.    Physical Exam Updated Vital Signs BP (!) 147/70 (BP Location: Right Arm)   Pulse 88   Temp 98.2 F (36.8 C) (Oral)   Resp 20   Ht 5\' 8"  (1.727 m)   Wt 105.7 kg   SpO2 100%   BMI 35.43 kg/m   Physical Exam Vitals and nursing note reviewed.  Constitutional:      General: She is not in acute distress.    Appearance: She is well-developed.  HENT:     Head: Normocephalic and atraumatic.     Right Ear: External ear normal.     Left Ear: External ear normal.     Nose: Nose normal.  Eyes:     Conjunctiva/sclera: Conjunctivae normal.  Cardiovascular:     Rate and Rhythm: Normal rate and regular rhythm.     Heart sounds: No murmur heard.   Pulmonary:     Effort: No respiratory distress.     Breath sounds: No wheezing, rhonchi or rales.  Abdominal:     Palpations: Abdomen is soft.     Tenderness: There is no abdominal tenderness. There is no guarding or rebound.  Musculoskeletal:     Cervical back: Normal range of motion and neck supple.     Right lower leg: No edema.     Left lower leg: No edema.     Comments: No tenderness to palpation.  Patient does appear somewhat uncomfortable when she repositions herself in the bed stating that she has pain in the back with movement.  Skin:    General: Skin is warm and dry.     Findings: No rash.  Neurological:     General: No focal deficit present.     Mental Status: She is alert. Mental status is at baseline.     Motor: No weakness.  Psychiatric:        Mood and Affect: Mood normal.     ED Results / Procedures / Treatments   Labs (all labs ordered are listed, but only abnormal results are displayed) Labs  Reviewed - No data to display  ED ECG REPORT   Date: 11/02/2020  Rate: 95  Rhythm: normal sinus rhythm  QRS Axis: normal  Intervals: normal  ST/T Wave abnormalities: normal  Conduction Disutrbances:none  Narrative Interpretation:   Old EKG Reviewed: changes noted, TWI improved from 11/16  I have personally reviewed the EKG tracing and agree with the computerized printout as noted.  Radiology DG Chest 2 View  Result Date: 11/02/2020 CLINICAL DATA:  Back pain EXAM: CHEST - 2 VIEW COMPARISON:  10/04/2015 FINDINGS: The heart size and mediastinal contours are within  normal limits. Both lungs are clear. The visualized skeletal structures are unremarkable. IMPRESSION: No active cardiopulmonary disease. Electronically Signed   By: Davina Poke D.O.   On: 11/02/2020 13:49    Procedures Procedures (including critical care time)  Medications Ordered in ED Medications - No data to display  ED Course  I have reviewed the triage vital signs and the nursing notes.  Pertinent labs & imaging results that were available during my care of the patient were reviewed by me and considered in my medical decision making (see chart for details).  Patient seen and examined.  Suspect musculoskeletal pain.  Will obtain chest x-ray.  Patient has no tenderness with palpation of the abdomen.  No significant tenderness with palpation of the back however she does have pain with movement.  She does not have any concerning features of DVT or PE.  No tachycardia and no hypoxia.  Do not suspect ACS.  EKG is completely normal.  If imaging is negative, plan discharged home with continued anti-inflammatories and muscle relaxer.  Discussed conservative measures over the upcoming weekend and follow-up with her doctor early next week if symptoms are not improving.  Discussed signs and symptoms to return including worsening uncontrolled pain, trouble breathing, abdominal pain, vomiting, diarrhea or fever.  Patient verbalized  understanding and agrees with plan.  Vital signs reviewed and are as follows: BP (!) 147/70 (BP Location: Right Arm)   Pulse 88   Temp 98.2 F (36.8 C) (Oral)   Resp 20   Ht 5\' 8"  (1.727 m)   Wt 105.7 kg   SpO2 100%   BMI 35.43 kg/m   2:03 PM CXR reviewed, negative.   D/c home with plan as above.     MDM Rules/Calculators/A&P                          Patient with bilateral mid back pain, worse with movement, discussion as above.  Normal EKG, chest x-ray.  No abdominal pain.  Do not suspect ACS, PE, infection.  Symptomatic treatment indicated with PCP follow-up as needed, return instructions as above.   Final Clinical Impression(s) / ED Diagnoses Final diagnoses:  Acute bilateral thoracic back pain    Rx / DC Orders ED Discharge Orders         Ordered    methocarbamol (ROBAXIN) 500 MG tablet  4 times daily        11/02/20 1402    naproxen (NAPROSYN) 500 MG tablet  2 times daily        11/02/20 1402           Carlisle Cater, PA-C 11/02/20 1404    Tegeler, Gwenyth Allegra, MD 11/02/20 [phone]

## 2020-11-02 NOTE — Discharge Instructions (Signed)
Please read and follow all provided instructions.  Your diagnoses today include:  1. Acute bilateral thoracic back pain     Tests performed today include:  Vital signs - see below for your results today  EKG - normal heart rhythm  Chest x-ray - no concerning findings  Medications prescribed:   Naproxen - anti-inflammatory pain medication  Do not exceed 500mg  naproxen every 12 hours, take with food  You have been prescribed an anti-inflammatory medication or NSAID. Take with food. Take smallest effective dose for the shortest duration needed for your pain. Stop taking if you experience stomach pain or vomiting.    Robaxin (methocarbamol) - muscle relaxer medication  DO NOT drive or perform any activities that require you to be awake and alert because this medicine can make you drowsy.   Take any prescribed medications only as directed.  Home care instructions:   Follow any educational materials contained in this packet  Please rest, use ice or heat on your back for the next several days  Do not lift, push, pull anything more than 10 pounds for the next week  Follow-up instructions: Please follow-up with your primary care provider in the next 1 week for further evaluation of your symptoms.   Return instructions:  SEEK IMMEDIATE MEDICAL ATTENTION IF YOU HAVE:  New numbness, tingling, weakness, or problem with the use of your arms or legs  Severe back pain not relieved with medications  Loss control of your bowels or bladder  Increasing pain in any areas of the body (such as chest or abdominal pain)  Shortness of breath, dizziness, or fainting.   Worsening nausea (feeling sick to your stomach), vomiting, fever, or sweats  Any other emergent concerns regarding your health   Additional Information:  Your vital signs today were: BP (!) 147/70 (BP Location: Right Arm)    Pulse 88    Temp 98.2 F (36.8 C) (Oral)    Resp 20    Ht 5\' 8"  (1.727 m)    Wt 105.7 kg     SpO2 100%    BMI 35.43 kg/m  If your blood pressure (BP) was elevated above 135/85 this visit, please have this repeated by your doctor within one month. --------------

## 2021-09-26 ENCOUNTER — Other Ambulatory Visit: Payer: Self-pay

## 2021-09-26 ENCOUNTER — Emergency Department (HOSPITAL_BASED_OUTPATIENT_CLINIC_OR_DEPARTMENT_OTHER)
Admission: EM | Admit: 2021-09-26 | Discharge: 2021-09-26 | Disposition: A | Discharge status: Home / Self Care | Payer: Medicaid Other | Attending: Emergency Medicine | Admitting: Emergency Medicine

## 2021-09-26 ENCOUNTER — Encounter (HOSPITAL_BASED_OUTPATIENT_CLINIC_OR_DEPARTMENT_OTHER): Payer: Self-pay

## 2021-09-26 DIAGNOSIS — Z79899 Other long term (current) drug therapy: Secondary | ICD-10-CM | POA: Diagnosis not present

## 2021-09-26 DIAGNOSIS — R42 Dizziness and giddiness: Secondary | ICD-10-CM | POA: Diagnosis not present

## 2021-09-26 DIAGNOSIS — Z7984 Long term (current) use of oral hypoglycemic drugs: Secondary | ICD-10-CM | POA: Insufficient documentation

## 2021-09-26 DIAGNOSIS — E119 Type 2 diabetes mellitus without complications: Secondary | ICD-10-CM | POA: Insufficient documentation

## 2021-09-26 DIAGNOSIS — Z87891 Personal history of nicotine dependence: Secondary | ICD-10-CM | POA: Diagnosis not present

## 2021-09-26 DIAGNOSIS — D259 Leiomyoma of uterus, unspecified: Secondary | ICD-10-CM | POA: Diagnosis not present

## 2021-09-26 DIAGNOSIS — N939 Abnormal uterine and vaginal bleeding, unspecified: Secondary | ICD-10-CM

## 2021-09-26 DIAGNOSIS — I10 Essential (primary) hypertension: Secondary | ICD-10-CM | POA: Insufficient documentation

## 2021-09-26 DIAGNOSIS — R5383 Other fatigue: Secondary | ICD-10-CM | POA: Diagnosis not present

## 2021-09-26 LAB — CBC
HCT: 43.9 % (ref 36.0–46.0)
Hemoglobin: 15 g/dL (ref 12.0–15.0)
MCH: 31.2 pg (ref 26.0–34.0)
MCHC: 34.2 g/dL (ref 30.0–36.0)
MCV: 91.3 fL (ref 80.0–100.0)
Platelets: 367 10*3/uL (ref 150–400)
RBC: 4.81 MIL/uL (ref 3.87–5.11)
RDW: 13.3 % (ref 11.5–15.5)
WBC: 7.7 10*3/uL (ref 4.0–10.5)
nRBC: 0 % (ref 0.0–0.2)

## 2021-09-26 LAB — PREGNANCY, URINE: Preg Test, Ur: NEGATIVE

## 2021-09-26 NOTE — Discharge Instructions (Signed)
Your hemoglobin is normal today and I did not see a significant amount of bleeding.  Please follow-up with your gynecologist tomorrow as planned.  Return for new or worsening symptoms.  Make sure you are staying well-hydrated and stand up slowly.

## 2021-09-26 NOTE — ED Provider Notes (Signed)
[facility] EMERGENCY DEPARTMENT Provider Note   CSN: [id number] Arrival date & time: 09/26/21  1824     History Chief Complaint  Patient presents with   Vaginal Bleeding    Sherry Blanchard is a 40 y.o. female.  Sherry Blanchard is a 40 y.o. female with a history of hypertension, diabetes, anxiety, acid reflux, uterine fibroids, who presents to the emergency department for evaluation of vaginal bleeding.  Patient reports she has been having persistent vaginal bleeding for the past 3 weeks.  She reports her menstrual cycles are typically heavy but usually only last 4 days ever since she had a uterine ablation about 5 years ago.  She has had issues with heavy vaginal bleeding previously related to her fibroids but it has not lasted this long before.  Patient reports that she has an appointment to see her gynecologist tomorrow but started to feel a bit lightheaded and fatigued today and was concerned so wanted to get checked out.  She reports she has some intermittent lower abdominal cramping associated with this bleeding which she reports feels like a typical menstrual plant but is not worse than usual.  She has not had any associated vaginal discharge.  She denies dysuria or urinary frequency.  No fevers or chills.  No syncope, chest pain or shortness of breath.  No other aggravating or alleviating factors.  The history is provided by the patient and medical records.      Past Medical History:  Diagnosis Date   Acid reflux    Anxiety    Blood transfusion without reported diagnosis    Diabetes mellitus without complication ([place])    Hypertension     There are no problems to display for this patient.   Past Surgical History:  Procedure Laterality Date   ANKLE SURGERY     TUBAL LIGATION       OB History   No obstetric history on file.     No family history on file.  Social History   Tobacco Use   Smoking status: Former   Smokeless tobacco: Never  Brewing technologist Use: Every day  Substance Use Topics   Alcohol use: Yes    Comment: daily   Drug use: No    Home Medications Prior to Admission medications   Medication Sig Start Date End Date Taking? Authorizing Provider  amLODipine (NORVASC) 5 MG tablet Take 5 mg by mouth daily.    [provider]  calcium carbonate (TUMS - DOSED IN MG ELEMENTAL CALCIUM) 500 MG chewable tablet Chew 1 tablet by mouth daily.    [provider]  diphenhydrAMINE (BENADRYL) 25 MG tablet Take 50 mg by mouth at bedtime.    [provider]  hydrochlorothiazide (HYDRODIURIL) 25 MG tablet Take 25 mg by mouth daily.    [provider]  losartan (COZAAR) 50 MG tablet Take by mouth. 12/20/19   [provider]  metFORMIN (GLUCOPHAGE) 500 MG tablet Take by mouth 2 (two) times daily with a meal.    [provider]  methocarbamol (ROBAXIN) 500 MG tablet Take 2 tablets (1,000 mg total) by mouth 4 (four) times daily. 11/02/20   Carlisle Cater, PA-C  naproxen (NAPROSYN) 500 MG tablet Take 1 tablet (500 mg total) by mouth 2 (two) times daily. 11/02/20   Carlisle Cater, PA-C  pantoprazole (PROTONIX) 40 MG tablet Take by mouth. 11/03/19   [provider]  SIMVASTATIN PO Take by mouth.    [provider]  vitamin C (ASCORBIC ACID) 500 MG tablet Take 500 mg by mouth daily.    [provider]  dicyclomine (BENTYL) 20 MG tablet Take 1 tablet (20 mg total) by mouth 2 (two) times daily. 01/27/20 11/02/20  Eustaquio Maize, PA-C    Allergies    Patient has no known allergies.  Review of Systems   Review of Systems  Constitutional:  Positive for fatigue. Negative for chills and fever.  HENT: Negative.    Respiratory:  Negative for cough and shortness of breath.   Cardiovascular:  Negative for chest pain.  Gastrointestinal:  Negative for abdominal pain, nausea and vomiting.  Genitourinary:  Positive for vaginal bleeding. Negative for dysuria, frequency and vaginal  discharge.  Musculoskeletal:  Negative for arthralgias and myalgias.  Neurological:  Positive for light-headedness. Negative for dizziness, syncope and weakness.  All other systems reviewed and are negative.  Physical Exam Updated Vital Signs BP (!) 144/87   Pulse 74   Temp 98 F (36.7 C) (Oral)   Resp 18   Ht 5\' 8"  (1.727 m)   Wt 94.3 kg   SpO2 99%   BMI 31.63 kg/m   Physical Exam Vitals and nursing note reviewed.  Constitutional:      General: She is not in acute distress.    Appearance: Normal appearance. She is well-developed. She is not ill-appearing or diaphoretic.  HENT:     Head: Normocephalic and atraumatic.  Eyes:     General:        Right eye: No discharge.        Left eye: No discharge.  Cardiovascular:     Rate and Rhythm: Normal rate and regular rhythm.     Pulses: Normal pulses.     Heart sounds: Normal heart sounds.  Pulmonary:     Effort: Pulmonary effort is normal. No respiratory distress.     Breath sounds: Normal breath sounds. No wheezing or rales.     Comments: Respirations equal and unlabored, patient able to speak in full sentences, lungs clear to auscultation bilaterally  Abdominal:     General: Bowel sounds are normal. There is no distension.     Palpations: Abdomen is soft. There is no mass.     Tenderness: There is no abdominal tenderness. There is no guarding.     Comments: Abdomen soft, nondistended, nontender to palpation in all quadrants without guarding or peritoneal signs  Genitourinary:    Comments: Chaperone present during pelvic exam. No external genital lesions noted. On speculum exam there is a very small amount of pooled blood in the vaginal vault, no active bleeding coming from the cervix, cervix and vaginal walls appear normal. Bimanual exam with no cervical motion tenderness or adnexal tenderness.  Palpable uterine fibroids without tenderness. Musculoskeletal:        General: No deformity.     Cervical back: Neck supple.   Skin:    General: Skin is warm and dry.     Capillary Refill: Capillary refill takes less than 2 seconds.  Neurological:     Mental Status: She is alert and oriented to person, place, and time.     Coordination: Coordination normal.     Comments: Speech is clear, able to follow commands Moves extremities without ataxia, coordination intact  Psychiatric:        Mood and Affect: Mood normal.        Behavior: Behavior normal.    ED Results / Procedures / Treatments   Labs (all labs ordered are  listed, but only abnormal results are displayed) Labs Reviewed  CBC  PREGNANCY, URINE    EKG None  Radiology No results found.  Procedures Procedures   Medications Ordered in ED Medications - No data to display  ED Course  I have reviewed the triage vital signs and the nursing notes.  Pertinent labs & imaging results that were available during my care of the patient were reviewed by me and considered in my medical decision making (see chart for details).    MDM Rules/Calculators/A&P                           40 year old female presents with 3 weeks of persistent vaginal bleeding.  Bleeding is not heavier than her usual menstrual cycle and she denies associated pain other than typical menstrual-like cramps.  No vaginal discharge or dysuria.  Reports she started to feel a bit fatigued and lightheaded today so wanted to get checked out, has an appointment with her gynecologist tomorrow.  No syncopal episodes.  On arrival patient is mildly hypertensive but vitals otherwise normal.  She is not having near syncopal symptoms in the room with position change.  On exam she has a small amount of blood pooled in the vaginal vault but no active or bright red bleeding.  Patient's hemoglobin is 15 and pregnancy test is negative.  Given that she does not have any tenderness on exam do not feel that patient needs emergent ultrasound or further evaluation.  Will have her follow-up with her gynecologist  tomorrow as planned.  At this time there does not appear to be any evidence of an acute emergency medical condition and the patient appears stable for discharge with appropriate outpatient follow up.Diagnosis was discussed with patient who verbalizes understanding and is agreeable to discharge.    Final Clinical Impression(s) / ED Diagnoses Final diagnoses:  Vaginal bleeding    Rx / DC Orders ED Discharge Orders     None        Janet Berlin 09/26/21 2021    Fredia Sorrow, MD 09/28/21 [phone]

## 2021-09-26 NOTE — ED Triage Notes (Signed)
Pt c/o vaginal bleeding x 3 weeks-NAD-steady gait

## 2022-06-24 IMAGING — CR DG CHEST 2V
2 series · 2 of 2 positions shown · non-contrast
Comparison: 10/04/2015

CLINICAL DATA: Back pain

EXAM:
CHEST - 2 VIEW

[w chest pa]
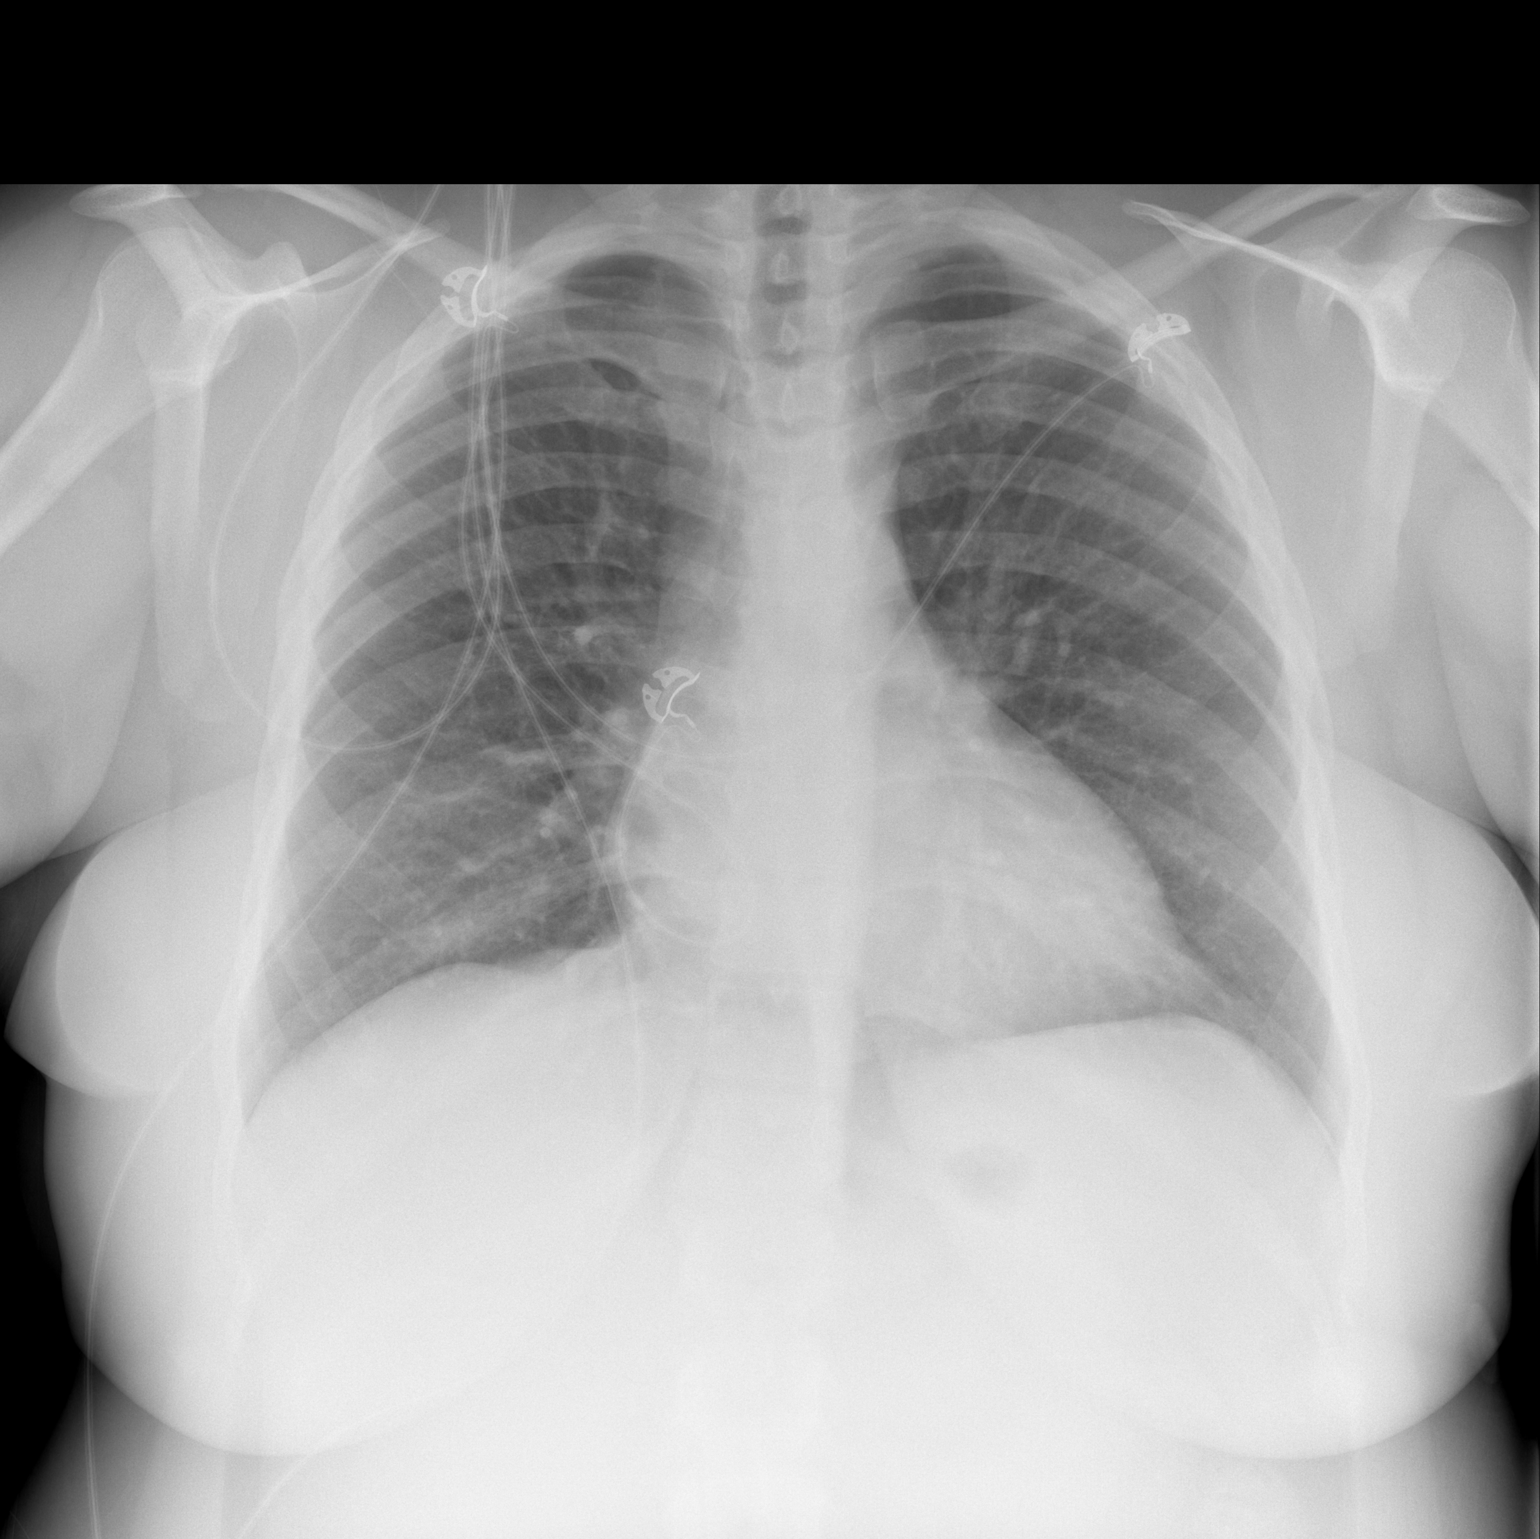

[w chest lat]
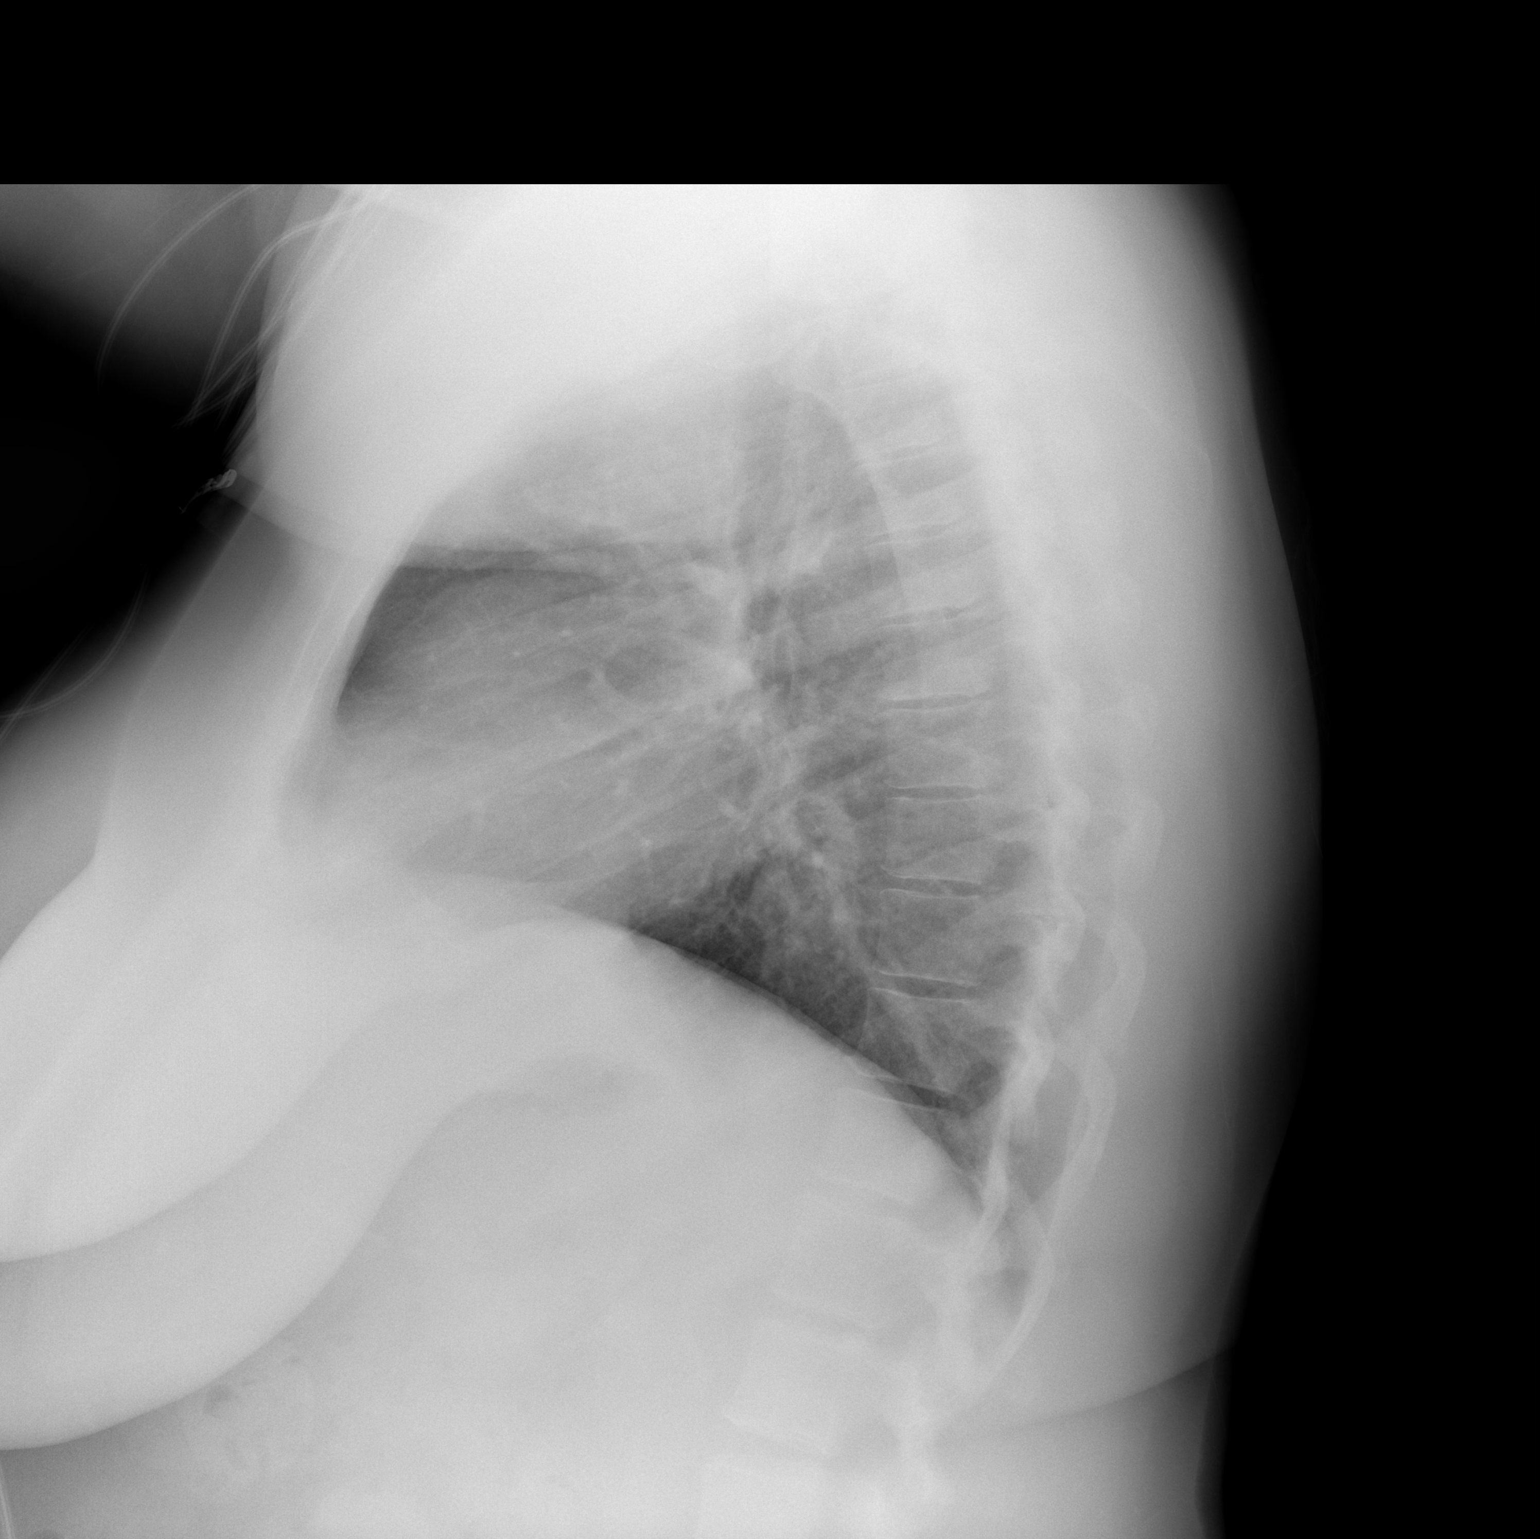

[2 of 2 positions shown; findings below may reference images not displayed]

FINDINGS: The heart size and mediastinal contours are within normal limits.
Both lungs are clear. The visualized skeletal structures are
unremarkable.
IMPRESSION: No active cardiopulmonary disease.

## 2023-03-10 ENCOUNTER — Emergency Department (HOSPITAL_BASED_OUTPATIENT_CLINIC_OR_DEPARTMENT_OTHER)
Admission: EM | Admit: 2023-03-10 | Discharge: 2023-03-10 | Discharge status: Against Medical Advice | Payer: Managed Care, Other (non HMO) | Attending: Emergency Medicine | Admitting: Emergency Medicine

## 2023-03-10 ENCOUNTER — Other Ambulatory Visit: Payer: Self-pay

## 2023-03-10 ENCOUNTER — Encounter (HOSPITAL_BASED_OUTPATIENT_CLINIC_OR_DEPARTMENT_OTHER): Payer: Self-pay

## 2023-03-10 DIAGNOSIS — Z5321 Procedure and treatment not carried out due to patient leaving prior to being seen by health care provider: Secondary | ICD-10-CM | POA: Insufficient documentation

## 2023-03-10 DIAGNOSIS — H9202 Otalgia, left ear: Secondary | ICD-10-CM | POA: Diagnosis not present

## 2023-03-10 NOTE — ED Triage Notes (Signed)
Pt c/o L sharp ear pain x1 week.  Pain score 10/10.  Pt reports being seen by UC and diagnosed w/ TMJ.  Pt was prescribed ibuprofen and Tizanidine.  Sts she has been taking them w/o relief.

## 2023-07-10 DIAGNOSIS — E7800 Pure hypercholesterolemia, unspecified: Secondary | ICD-10-CM | POA: Diagnosis not present

## 2023-07-10 DIAGNOSIS — E559 Vitamin D deficiency, unspecified: Secondary | ICD-10-CM | POA: Diagnosis not present

## 2023-07-10 DIAGNOSIS — Z1231 Encounter for screening mammogram for malignant neoplasm of breast: Secondary | ICD-10-CM | POA: Diagnosis not present

## 2023-07-10 DIAGNOSIS — I10 Essential (primary) hypertension: Secondary | ICD-10-CM | POA: Diagnosis not present

## 2023-07-10 DIAGNOSIS — E1165 Type 2 diabetes mellitus with hyperglycemia: Secondary | ICD-10-CM | POA: Diagnosis not present

## 2023-07-10 DIAGNOSIS — Z23 Encounter for immunization: Secondary | ICD-10-CM | POA: Diagnosis not present

## 2023-08-15 DIAGNOSIS — Z1231 Encounter for screening mammogram for malignant neoplasm of breast: Secondary | ICD-10-CM | POA: Diagnosis not present

## 2023-11-09 ENCOUNTER — Institutional Professional Consult (permissible substitution): Payer: No Typology Code available for payment source | Admitting: Plastic Surgery

## 2024-07-08 DIAGNOSIS — R002 Palpitations: Secondary | ICD-10-CM | POA: Diagnosis not present

## 2024-07-08 DIAGNOSIS — E7800 Pure hypercholesterolemia, unspecified: Secondary | ICD-10-CM | POA: Diagnosis not present

## 2024-07-08 DIAGNOSIS — I10 Essential (primary) hypertension: Secondary | ICD-10-CM | POA: Diagnosis not present

## 2024-07-08 DIAGNOSIS — E1165 Type 2 diabetes mellitus with hyperglycemia: Secondary | ICD-10-CM | POA: Diagnosis not present

## 2024-07-08 DIAGNOSIS — E559 Vitamin D deficiency, unspecified: Secondary | ICD-10-CM | POA: Diagnosis not present

## 2024-07-15 DIAGNOSIS — R002 Palpitations: Secondary | ICD-10-CM | POA: Diagnosis not present

## 2024-07-28 DIAGNOSIS — R002 Palpitations: Secondary | ICD-10-CM | POA: Diagnosis not present
